# Patient Record
Sex: Female | Born: 1935 | Race: Black or African American | Hispanic: No | Marital: Married | Smoking: Never smoker
Health system: Southern US, Community
[De-identification: ages and names within clinical notes are randomized; demographics above are authoritative.]

## PROBLEM LIST (undated history)

## (undated) DIAGNOSIS — I10 Essential (primary) hypertension: Secondary | ICD-10-CM

## (undated) DIAGNOSIS — H539 Unspecified visual disturbance: Secondary | ICD-10-CM

## (undated) DIAGNOSIS — K449 Diaphragmatic hernia without obstruction or gangrene: Secondary | ICD-10-CM

## (undated) DIAGNOSIS — E079 Disorder of thyroid, unspecified: Secondary | ICD-10-CM

## (undated) DIAGNOSIS — K2100 Gastro-esophageal reflux disease with esophagitis, without bleeding: Secondary | ICD-10-CM

## (undated) DIAGNOSIS — I252 Old myocardial infarction: Secondary | ICD-10-CM

## (undated) DIAGNOSIS — I2510 Atherosclerotic heart disease of native coronary artery without angina pectoris: Secondary | ICD-10-CM

## (undated) HISTORY — PX: REPLACEMENT TOTAL KNEE BILATERAL: SUR1225

## (undated) HISTORY — DX: Unspecified visual disturbance: H53.9

## (undated) HISTORY — PX: JOINT REPLACEMENT: SHX530

## (undated) HISTORY — PX: BIOPSY THYROID: PRO38

## (undated) HISTORY — PX: ABDOMINAL HYSTERECTOMY: SHX81

## (undated) HISTORY — PX: CARDIAC ELECTROPHYSIOLOGY MAPPING AND ABLATION: SHX1292

## (undated) HISTORY — PX: CARDIAC DEFIBRILLATOR PLACEMENT: SHX171

## (undated) HISTORY — DX: Atherosclerotic heart disease of native coronary artery without angina pectoris: I25.10

## (undated) HISTORY — PX: CARDIAC CATHETERIZATION: SHX172

## (undated) HISTORY — DX: Essential (primary) hypertension: I10

---

## 2010-06-27 DIAGNOSIS — I252 Old myocardial infarction: Secondary | ICD-10-CM

## 2010-06-27 HISTORY — DX: Old myocardial infarction: I25.2

## 2014-07-24 ENCOUNTER — Ambulatory Visit (INDEPENDENT_AMBULATORY_CARE_PROVIDER_SITE_OTHER): Payer: Medicare Other | Admitting: Neurology

## 2014-07-24 ENCOUNTER — Encounter: Payer: Self-pay | Admitting: Neurology

## 2014-07-24 DIAGNOSIS — M25512 Pain in left shoulder: Secondary | ICD-10-CM

## 2014-07-24 DIAGNOSIS — M4317 Spondylolisthesis, lumbosacral region: Secondary | ICD-10-CM | POA: Insufficient documentation

## 2014-07-24 DIAGNOSIS — M549 Dorsalgia, unspecified: Secondary | ICD-10-CM | POA: Insufficient documentation

## 2014-07-24 DIAGNOSIS — M25511 Pain in right shoulder: Secondary | ICD-10-CM

## 2014-07-24 DIAGNOSIS — M5441 Lumbago with sciatica, right side: Secondary | ICD-10-CM | POA: Insufficient documentation

## 2014-07-24 DIAGNOSIS — I10 Essential (primary) hypertension: Secondary | ICD-10-CM | POA: Insufficient documentation

## 2014-07-24 DIAGNOSIS — R51 Headache: Secondary | ICD-10-CM

## 2014-07-24 DIAGNOSIS — I519 Heart disease, unspecified: Secondary | ICD-10-CM | POA: Insufficient documentation

## 2014-07-24 DIAGNOSIS — R519 Headache, unspecified: Secondary | ICD-10-CM | POA: Insufficient documentation

## 2014-07-24 DIAGNOSIS — M25519 Pain in unspecified shoulder: Secondary | ICD-10-CM | POA: Insufficient documentation

## 2014-07-24 DIAGNOSIS — M542 Cervicalgia: Secondary | ICD-10-CM

## 2014-07-24 DIAGNOSIS — F411 Generalized anxiety disorder: Secondary | ICD-10-CM | POA: Insufficient documentation

## 2014-07-24 MED ORDER — BACLOFEN 10 MG PO TABS: ORAL_TABLET | ORAL | Status: DC

## 2014-07-24 NOTE — Progress Notes (Signed)
[facility]  PATIENT: Carolyn Patton DOB: 1929-12-11  REFERRING CLINICIAN: Dr. Asher Muir HISTORY FROM: patient REASON FOR VISIT: facial pain and shoulder pain and neck/back pain   HISTORICAL  CHIEF COMPLAINT:  Chief Complaint  Patient presents with  . Facial Pain    Sts. bilat shoulder pain has been some worse--sts. Dr. Asher Muir gave her ?im steroids which helped some.  Heat helps.  Today she has new c/o left sided facial/temporal pain, intermittent, sharp in nature.  Onset one month ago.  Assoc. with h/a.  Some relief with Vistaril 61m tid/fim  . Back Pain  . Neck Pain    HISTORY OF PRESENT ILLNESS:  BAntonela Patton an 81year old woman who I have followed for neck pain, shoulder pain and low back pain with radiculopathy. She was last seen on 05/07/2014 when she had a right piriformis muscle trigger point injection and bilateral C6-C7 paraspinal and trapezius muscle injections.   In the past she has had similar injections as well as subacromial bursa injections, usually with benefit for 3 - 4 months. She has also received some benefit from epidural steroid injections for her back pain.  She began to have some facial pain on the left about 2 months ago. She is getting a spasm of pain that lasts just seconds and is occurring every week or so. Dr. RAsher Muirstarted her on hydroxyzine 25 mg and that has helped a little bit.   The rest of the time, she has a milder low level pain  Her shoulders are painful. The pain is located in the bursa region. Subacromial bursal injections in the past have been of benefit. The last one lasted a few months.  Shoulder pain is worse on the left than the right.   Back pain and hip pain are similar to pain she's had in the past. She would like to have another piriformis injection as these have helped in the past.  Pain radiates down the right leg when more severe   She had a head CT on 12/20/2012 after a minor head injury with a small  concussion. It was normal for her age. ESR has been normal in the past. MRI of the lumbar spine dated 12/02/2009 shows grade 2 anterolisthesis at L5 S1 associated with facet hypertrophy and disc protrusion with strong potential for left greater the right L5 nerve root compression and there could also be impingement of the left S1 nerve root. At L4-L5 there is a combination of degenerative changes causing moderate left foraminal narrowing that could lead to dynamic impingement of the left L4 nerve root.  Nerve conduction EMG study dated 09/01/2010 showed mild left tunnel syndrome. She was stable when compared to a previous one dated December 2007. 9  REVIEW OF SYSTEMS:  Constitutional: No fevers, chills, sweats, or change in appetite Eyes: No visual changes, double vision, eye pain Ear, nose and throat: No hearing loss, ear pain, nasal congestion, sore throat Cardiovascular: No chest pain, palpitations Respiratory:  No shortness of breath at rest or with exertion.   No wheezes GastrointestinaI: No nausea, vomiting, diarrhea, abdominal pain, fecal incontinence Genitourinary:  No dysuria, urinary retention or frequency.  No nocturia. Musculoskeletal:  see above Integumentary: No rash, pruritus, skin lesions Neurological: as above Psychiatric: No depression at this time.  No anxiety Endocrine: No palpitations, diaphoresis, change in appetite, change in weigh or increased thirst Hematologic/Lymphatic:  No anemia, purpura, petechiae. Allergic/Immunologic: No itchy/runny eyes, nasal congestion, recent allergic reactions, rashes  ALLERGIES: Allergies  Allergen Reactions  . Sulfa Antibiotics Itching    HOME MEDICATIONS: No outpatient prescriptions prior to visit.   No facility-administered medications prior to visit.    PAST MEDICAL HISTORY: Past Medical History  Diagnosis Date  . Coronary artery disease   . Hypertension   . Vision abnormalities     PAST SURGICAL HISTORY: Past  Surgical History  Procedure Laterality Date  . Cardiac catheterization      3 stents  . Cardiac electrophysiology mapping and ablation    . Replacement total knee bilateral    . Biopsy thyroid      FAMILY HISTORY: Family History  Problem Relation Age of Onset  . Stroke Mother   . Heart disease Father   . Pneumonia Father     SOCIAL HISTORY:  History   Social History  . Marital Status: Married    Spouse Name: N/A    Number of Children: N/A  . Years of Education: N/A   Occupational History  . Not on file.   Social History Main Topics  . Smoking status: Never Smoker   . Smokeless tobacco: Not on file  . Alcohol Use: No  . Drug Use: No  . Sexual Activity: Not on file   Other Topics Concern  . Not on file   Social History Narrative  . No narrative on file     PHYSICAL EXAM  Filed Vitals:   07/24/14 1047  BP: 166/90  Pulse: 70  Resp: 14  Height: 5' 5" (1.651 m)  Weight: 162 lb 3.2 oz (73.573 kg)    Body mass index is 26.99 kg/(m^2).   General: The patient is well-developed and well-nourished and in no acute distress  Neck: The neck is supple, no carotid bruits are noted.  The neck is mildly tender over lower cervical paraspinal muscles.   Skin: Extremities are without significant edema.  Musculoskeletal:   She is tender over the right >> left piriformis muscle.   Trochanteric bursae are nontender.    She has left > right subacromial bursa tenderness.     Neurologic Exam  Mental status: The patient is alert and oriented x 3 at the time of the examination. The patient has apparent normal recent and remote memory, with an apparently normal attention span and concentration ability.   Speech is normal.  Cranial nerves: Extraocular movements are full.   Facial symmetry is present. There is good facial sensation to soft touch bilaterally.Facial strength is normal.  Trapezius and sternocleidomastoid strength is normal. No dysarthria is noted.   No obvious  hearing deficits are noted.  Motor:  Muscle bulk and tone are normal. Strength is  5 / 5 in all 4 extremities.   Sensory: Sensory testing is intact to pinprick, soft touch, vibration sensation, and position sense on all 4 extremities.  Coordination: Cerebellar testing reveals good finger-nose-finger and heel-to-shin bilaterally.  Gait and station: Station is normal and gait is arthritic. Tandem gait is wide stance but probably normal for age. Romberg is negative.   Reflexes: Deep tendon reflexes are symmetric and normal bilaterally. Plantar responses are normal.    DIAGNOSTIC DATA (LABS, IMAGING, TESTING) - I reviewed patient records, labs, notes, testing and imaging myself where available.    Records, including labs and MRI reports from [facility] were reviewed.     ASSESSMENT AND PLAN  Facial pain  Pain in joint, shoulder region, right  Spondylolisthesis at L5-S1 level  Neck pain  Pain in joint, shoulder region, left  Bilateral   low back pain with right-sided sciatica  Summary, Carolyn Patton is an 81-year-old woman with a new complaint of left-sided facial pain and worsening of her shoulder pain and back pain.  I am not sure what is causing her facial pain. This might be due to trigeminal neuralgia but the events are very rare. At her age, if she has trigeminal neuralgia, this would be more likely due to an abberrent loop of blood vessel.    As the episodes are fairly rare, I will switch her Flexeril to baclofen as baclofen can help trigeminal neuralgia. If this worsens, however, we will need to check an MRI of the brain and I would also consider adding an antiepileptic.  Symptoms are similar to what she has experienced in the past. I would do a bilateral subacromial bursa injection of the shoulders with a total of 40 mg Depo-Medrol in Marcaine using sterile technique.. We will also do trigger point injections into the C6-C7 paraspinal muscles, L5-S1 paraspinal muscles  and the right piriformis muscle with 40 mg Depo Medrol in Marcaine. She tolerated the procedure well.  She will return to see me in 4 months, sooner if she has new or worsening neurologic symptoms.    Richard A. Sater, MD, PhD 07/24/2014, 11:09 AM Certified in Neurology, Clinical Neurophysiology, Sleep Medicine, Pain Medicine and Neuroimaging  [facility] [address], [address] [place], [place] [address] [phone] 

## 2014-07-24 NOTE — Patient Instructions (Addendum)
If pain worsens, call us as we may need to do further tests and change medications

## 2014-09-16 ENCOUNTER — Ambulatory Visit (INDEPENDENT_AMBULATORY_CARE_PROVIDER_SITE_OTHER): Payer: Medicare Other | Admitting: Neurology

## 2014-09-16 ENCOUNTER — Encounter: Payer: Self-pay | Admitting: Neurology

## 2014-09-16 VITALS — BP 148/80 | HR 68 | Resp 14 | Ht 65.0 in | Wt 161.0 lb

## 2014-09-16 DIAGNOSIS — M542 Cervicalgia: Secondary | ICD-10-CM

## 2014-09-16 DIAGNOSIS — M5441 Lumbago with sciatica, right side: Secondary | ICD-10-CM

## 2014-09-16 DIAGNOSIS — M4317 Spondylolisthesis, lumbosacral region: Secondary | ICD-10-CM | POA: Diagnosis not present

## 2014-09-16 DIAGNOSIS — M25519 Pain in unspecified shoulder: Secondary | ICD-10-CM

## 2014-09-16 NOTE — Progress Notes (Signed)
[facility]  PATIENT: Carolyn Patton DOB: 09/25/1929   HISTORICAL  CHIEF COMPLAINT:  Chief Complaint  Patient presents with  . Back Pain    Sts. lower back pain has been worse over the last week.  No new injury.   She saw her pcp and r/o uti.  Sts. shoulder and neck pain are improved since tpi's given at last ov, but still present.  She would like tpi's today if appropriate/fim    HISTORY OF PRESENT ILLNESS:  Carolyn Patton is an 81 year old woman who I have followed for neck pain, shoulder pain and low back pain with radiculopathy. She was last seen on 07/24/14  when she had a right piriformis muscle trigger point injection and bilateral L5, S1 and C6-C7 paraspinal and trapezius muscle injections.   She also had subacromial bursa injections 07/24/14   LBP:   Back pain is similar to pain she's had in the past --- mostly in buttock.  Right worse than leftcurrently. She would like to have another piriformis injection as these have helped in the past.  Pain is not really radiating down the right leg as it has done in past   MRI of the lumbar spine dated 12/02/2009 shows grade 2 anterolisthesis at L5 S1 associated with facet hypertrophy and disc protrusion with strong potential for left greater the right L5 nerve root compression and there could also be impingement of the left S1 nerve root. At L4-L5 there is a combination of degenerative changes causing moderate left foraminal narrowing that could lead to dynamic impingement of the left L4 nerve root.  Shoulder/neck pain:   Her shoulders are better since [facility] joint injection but still having pain, worse with shoulder rotation.   Shoulder pain is worse on the left than the right.   Neck pain is mildly better since TPI in January.  Left side bot bad but right has spasm pain.   Other:    She had a head CT on 12/20/2012 after a minor head injury with a small concussion. It was normal for her age. ESR has been normal in the past.      Nerve conduction EMG study dated 09/01/2010 showed mild left tunnel syndrome. She was stable when compared to a previous one dated December 2007.   REVIEW OF SYSTEMS:  Constitutional: No fevers, chills, sweats, or change in appetite Cardiovascular: No chest pain, palpitations Respiratory:  No shortness of breath at rest or with exertion.   No wheezes Genitourinary:  No dysuria, urinary retention or frequency.  No nocturia. Musculoskeletal:  see above Neurological: as above Psychiatric: No depression at this time.  No anxiety   ALLERGIES: Allergies  Allergen Reactions  . Sulfa Antibiotics Itching    HOME MEDICATIONS: Outpatient Prescriptions Prior to Visit  Medication Sig Dispense Refill  . acetaminophen-codeine (TYLENOL #3) 300-30 MG per tablet Take 1 tablet by mouth every 4 (four) hours as needed. for pain  0  . ALPRAZolam (XANAX) 0.5 MG tablet Take 0.5 mg by mouth 3 (three) times daily as needed for anxiety.    Marland Kitchen amiodarone (PACERONE) 200 MG tablet Take 200 mg by mouth daily.  6  . aspirin 81 MG tablet Take 81 mg by mouth daily.    Marland Kitchen atorvastatin (LIPITOR) 10 MG tablet Take 10 mg by mouth daily.  99  . baclofen (LIORESAL) 10 MG tablet 1/2 to 1 pill po tid 90 each 5  . carvedilol (COREG) 12.5 MG tablet Take 12.5 mg by mouth 2 (  two) times daily.  11  . clopidogrel (PLAVIX) 75 MG tablet Take 75 mg by mouth daily. with food  2  . cyclobenzaprine (FLEXERIL) 5 MG tablet Take 5 mg by mouth 3 (three) times daily as needed for muscle spasms.    Marland Kitchen dicyclomine (BENTYL) 10 MG capsule Take 10 mg by mouth 4 (four) times daily as needed for spasms.    . folic acid (FOLVITE) 1 MG tablet Take 1 mg by mouth daily.    . hydrOXYzine (ATARAX/VISTARIL) 25 MG tablet Take 25 mg by mouth 3 (three) times daily as needed.    Marland Kitchen losartan (COZAAR) 50 MG tablet Take 50 mg by mouth daily.  1  . nitroGLYCERIN (NITROSTAT) 0.4 MG SL tablet Place 0.4 mg under the tongue every 5 (five) minutes as needed for chest  pain.     No facility-administered medications prior to visit.    PAST MEDICAL HISTORY: Past Medical History  Diagnosis Date  . Coronary artery disease   . Hypertension   . Vision abnormalities     PAST SURGICAL HISTORY: Past Surgical History  Procedure Laterality Date  . Cardiac catheterization      3 stents  . Cardiac electrophysiology mapping and ablation    . Replacement total knee bilateral    . Biopsy thyroid      FAMILY HISTORY: Family History  Problem Relation Age of Onset  . Stroke Mother   . Heart disease Father   . Pneumonia Father     SOCIAL HISTORY:  History   Social History  . Marital Status: Married    Spouse Name: N/A  . Number of Children: N/A  . Years of Education: N/A   Occupational History  . Not on file.   Social History Main Topics  . Smoking status: Never Smoker   . Smokeless tobacco: Not on file  . Alcohol Use: No  . Drug Use: No  . Sexual Activity: Not on file   Other Topics Concern  . Not on file   Social History Narrative     PHYSICAL EXAM  Filed Vitals:   09/16/14 1305  BP: 148/80  Pulse: 68  Resp: 14  Height: _0  (1.651 m)  Weight: 161 lb (73.029 kg)    Body mass index is 26.79 kg/(m^2).   General: The patient is well-developed and well-nourished and in no acute distress  Neck: The neck is supple, no carotid bruits are noted.  The right neck is mildly tender over lower cervical paraspinal an trapezius  muscles.   Skin: Extremities are without significant edema.  Musculoskeletal:   She is tender over the right >> left piriformis muscle.   Trochanteric bursae are nontender.    She has only minimal subacromial bursa tenderness.     Neurologic Exam  Mental status: The patient is alert and oriented x 3 at the time of the examination. The patient has apparent normal recent and remote memory, with an apparently normal attention span and concentration ability.   Speech is normal.  Cranial nerves: Extraocular  movements are full.   Facial symmetry is present. There is good facial sensation to soft touch bilaterally.Facial strength is normal.  Trapezius and sternocleidomastoid strength is normal. No dysarthria is noted.   No obvious hearing deficits are noted.  Motor:  Muscle bulk and tone are normal. Strength is  5 / 5 in all 4 extremities.   Sensory: Sensory testing is intact to touch on all 4 extremities.  Gait and station: Station is  normal and gait is arthritic. Tandem gait is wide stance but probably normal for age. Romberg is negative.   Reflexes: Deep tendon reflexes are symmetric and normal bilaterally.    DIAGNOSTIC DATA (LABS, IMAGING, TESTING) - I reviewed patient records, labs, notes, testing and imaging myself where available.    Records, including labs and MRI reports from [facility] were reviewed.     ASSESSMENT AND PLAN  Neck pain  Bilateral low back pain with right-sided sciatica  Spondylolisthesis at L5-S1 level  Pain in joint, shoulder region, unspecified laterality    1.  trigger point injections into the right trapezius and C6-C7 paraspinal muscles and the bilateral piriformis muscle with 80 mg Depo Medrol in Marcaine. She tolerated the procedure well. 2.   contiunue med's 3.    Discussed shoulder exercises.    She will return to see me in 4 months, sooner if she has new or worsening neurologic symptoms.  Lynell Greenhouse A. Felecia Shelling, MD, PhD 7/36/6815, 9:47 PM Certified in Neurology, Clinical Neurophysiology, Sleep Medicine, Pain Medicine and Neuroimaging  [facility] [address] [place], [place] [address] [phone]

## 2014-11-25 ENCOUNTER — Ambulatory Visit: Payer: Medicare Other | Admitting: Neurology

## 2014-12-12 DIAGNOSIS — I471 Supraventricular tachycardia: Secondary | ICD-10-CM | POA: Insufficient documentation

## 2014-12-12 DIAGNOSIS — E785 Hyperlipidemia, unspecified: Secondary | ICD-10-CM | POA: Insufficient documentation

## 2014-12-12 DIAGNOSIS — M25512 Pain in left shoulder: Secondary | ICD-10-CM | POA: Insufficient documentation

## 2014-12-12 DIAGNOSIS — I2510 Atherosclerotic heart disease of native coronary artery without angina pectoris: Secondary | ICD-10-CM | POA: Insufficient documentation

## 2015-01-14 ENCOUNTER — Telehealth: Payer: Self-pay | Admitting: Neurology

## 2015-01-14 ENCOUNTER — Ambulatory Visit (INDEPENDENT_AMBULATORY_CARE_PROVIDER_SITE_OTHER): Payer: Medicare Other | Admitting: Neurology

## 2015-01-14 ENCOUNTER — Encounter: Payer: Self-pay | Admitting: Neurology

## 2015-01-14 VITALS — BP 152/86 | HR 72 | Resp 12 | Ht 65.0 in | Wt 157.0 lb

## 2015-01-14 DIAGNOSIS — M25512 Pain in left shoulder: Secondary | ICD-10-CM

## 2015-01-14 DIAGNOSIS — M25511 Pain in right shoulder: Secondary | ICD-10-CM

## 2015-01-14 DIAGNOSIS — G4700 Insomnia, unspecified: Secondary | ICD-10-CM | POA: Diagnosis not present

## 2015-01-14 DIAGNOSIS — M5441 Lumbago with sciatica, right side: Secondary | ICD-10-CM

## 2015-01-14 DIAGNOSIS — M542 Cervicalgia: Secondary | ICD-10-CM

## 2015-01-14 NOTE — Telephone Encounter (Signed)
I spoke to Mrs Carolyn Patton to check up on her arm numbness and weakness.   She feels the same now as she did around noon today --  We will call again in the morning to check on her.  Effects of marcaine should be worn off after 8 hours

## 2015-01-14 NOTE — Progress Notes (Signed)
[facility]  PATIENT: Carolyn Patton DOB: 12/22/1929   HISTORICAL  CHIEF COMPLAINT:  Chief Complaint  Patient presents with  . Neck Pain    Sts. bilat shoulder pain have been worse, right is worse than left.  Sts. non-radiating lbp has been worse./fim  . Back Pain    HISTORY OF PRESENT ILLNESS:  Carolyn Patton is an 81 year old woman who I have followed for neck pain, shoulder pain and low back pain with radiculopathy. She was last seen in April 2016  when she had a right piriformis muscle trigger point injection and bilateral L5, S1 and C6-C7 paraspinal and trapezius muscle injections.     Shoulder/neck pain:   Shoulder pain is worse again.   In the past, she had benefit from [facility] joint and/or subacromial bursa injections.  This pain feels a little different.   Pain is worse with shoulder elevation and rotation.   Shoulder pain is bilateral    She denies arm weakness.   Neck pain is also worse and is bilateral in the lower neck and trapezius muscles.   It is increased by movements of her neck.     LBP:   Lower back pain is back and is similar to pain she's had in the past.   Pin is always worse on her right.   Pain is radiating to the back of the upper leg on the right but not into the feet.   Piriformis injectionshave helped in the past.     Insomnia:   She has noted some insomnia with both sleep onset and sleep maintenance. She sometimes has trouble getting comfortable.      Data: MRI of the lumbar spine dated 12/02/2009 shows grade 2 anterolisthesis at L5 S1 associated with facet hypertrophy and disc protrusion with strong potential for left greater the right L5 nerve root compression and there could also be impingement of the left S1 nerve root. At L4-L5 there is a combination of degenerative changes causing moderate left foraminal narrowing that could lead to dynamic impingement of the left L4 nerve root.  REVIEW OF SYSTEMS:  Constitutional: No fevers, chills,  sweats, or change in appetite Cardiovascular: No chest pain, palpitations Respiratory:  No shortness of breath at rest or with exertion.   No wheezes Genitourinary:  No dysuria, urinary retention or frequency.  No nocturia. Musculoskeletal:  see above Neurological: as above Psychiatric: No depression at this time.  No anxiety   ALLERGIES: Allergies  Allergen Reactions  . Sulfa Antibiotics Itching    HOME MEDICATIONS: Outpatient Prescriptions Prior to Visit  Medication Sig Dispense Refill  . acetaminophen-codeine (TYLENOL #3) 300-30 MG per tablet Take 1 tablet by mouth every 4 (four) hours as needed. for pain  0  . ALPRAZolam (XANAX) 0.5 MG tablet Take 0.5 mg by mouth 3 (three) times daily as needed for anxiety.    Marland Kitchen amiodarone (PACERONE) 200 MG tablet Take 200 mg by mouth daily.  6  . aspirin 81 MG tablet Take 81 mg by mouth daily.    Marland Kitchen atorvastatin (LIPITOR) 10 MG tablet Take 10 mg by mouth daily.  99  . baclofen (LIORESAL) 10 MG tablet 1/2 to 1 pill po tid 90 each 5  . carvedilol (COREG) 12.5 MG tablet Take 12.5 mg by mouth 2 (two) times daily.  11  . clopidogrel (PLAVIX) 75 MG tablet Take 75 mg by mouth daily. with food  2  . cyclobenzaprine (FLEXERIL) 5 MG tablet Take 5 mg by mouth  3 (three) times daily as needed for muscle spasms.    Marland Kitchen. dicyclomine (BENTYL) 10 MG capsule Take 10 mg by mouth 4 (four) times daily as needed for spasms.    . folic acid (FOLVITE) 1 MG tablet Take 1 mg by mouth daily.    . hydrOXYzine (ATARAX/VISTARIL) 25 MG tablet Take 25 mg by mouth 3 (three) times daily as needed.    Marland Kitchen. losartan (COZAAR) 50 MG tablet Take 50 mg by mouth daily.  1  . nitroGLYCERIN (NITROSTAT) 0.4 MG SL tablet Place 0.4 mg under the tongue every 5 (five) minutes as needed for chest pain.     No facility-administered medications prior to visit.    PAST MEDICAL HISTORY: Past Medical History  Diagnosis Date  . Coronary artery disease   . Hypertension   . Vision abnormalities      PAST SURGICAL HISTORY: Past Surgical History  Procedure Laterality Date  . Cardiac catheterization      3 stents  . Cardiac electrophysiology mapping and ablation    . Replacement total knee bilateral    . Biopsy thyroid      FAMILY HISTORY: Family History  Problem Relation Age of Onset  . Stroke Mother   . Heart disease Father   . Pneumonia Father     SOCIAL HISTORY:  History   Social History  . Marital Status: Married    Spouse Name: N/A  . Number of Children: N/A  . Years of Education: N/A   Occupational History  . Not on file.   Social History Main Topics  . Smoking status: Never Smoker   . Smokeless tobacco: Not on file  . Alcohol Use: No  . Drug Use: No  . Sexual Activity: Not on file   Other Topics Concern  . Not on file   Social History Narrative     PHYSICAL EXAM  Filed Vitals:   01/14/15 1056  BP: 152/86  Pulse: 72  Resp: 12  Height: 5\' 5"  (1.651 m)  Weight: 157 lb (71.215 kg)    Body mass index is 26.13 kg/(m^2).   General: The patient is well-developed and well-nourished and in no acute distress  Neck: The neck is supple.  The right neck is tender over lower cervical paraspinal and trapezius  Muscles on both sides.   Skin: Extremities are without significant edema.  Musculoskeletal:   She is tender over the right > left piriformis muscle.   Trochanteric bursae are nontender.    She has only minimal subacromial bursa tenderness but moderate glenohumeral joint tenderness  Neurologic Exam  Mental status: The patient is alert and oriented x 3 at the time of the examination.Marland Kitchen.   Speech is normal.  Cranial nerves: Extraocular movements are full.   Facial strength is normal.  Trapezius and sternocleidomastoid strength is normal. No dysarthria is noted.   No obvious hearing deficits are noted.  Motor:  Muscle bulk and tone are normal. Strength is  5 / 5 in all 4 extremities.   Sensory: Sensory testing is intact to touch on all 4  extremities.  Gait and station: Station is normal and gait is arthritic. Tandem gait has mildly wide stance but probably normal for age.   Reflexes: Deep tendon reflexes are symmetric and normal bilaterally.        ASSESSMENT AND PLAN  Pain in joint, shoulder region, right  Pain in left shoulder  Neck pain  Bilateral low back pain with right-sided sciatica  Insomnia  1.  trigger point injections into the bilateral trapezius and C6-C7 paraspinal muscles and the bilateral piriformis muscle with 50 mg Depo Medrol in Marcaine. She tolerated the procedure well. 2.   Glenohumeral (shoulder) joint injections bilateral with 30 mg Depo-Medrol in Marcaine using sterile technique. She tolerated the procedure well and shoulder pain was better afterwards. 3.    We discussed shoulder and neck exercises.   4.  If the insomnia worsens, consider trazodone or other medication.  She will return to see me in 3- 4 months, sooner if she has new or worsening neurologic symptoms.  Richard A. Epimenio Foot, MD, PhD 01/14/2015, 11:15 AM Certified in Neurology, Clinical Neurophysiology, Sleep Medicine, Pain Medicine and Neuroimaging  [facility] [address] [place], [place] [address] [phone]  ADDENDUM:  A few minutes after she left the office, she noted weakness and numbness in the right arm. I had her come back and reexamine her. She has numbness and weakness in the right radial nerve distribution. Strength was 5 out of 5 in all other groups. Her shoulder pain was much better and she felt she had better range of motion in the shoulder. She had no tenderness near the site of injection or in the axilla. She had not reported pain during the actual injection earlier. I told her I believe that some of the Marcaine must have been injected outside of the joint, numbing up the right radial nerve. I do not believe that this represents a stroke. I told her if she has significantly more  symptoms that she should go to the emergency room. We will check on her later  RAS

## 2015-01-15 ENCOUNTER — Telehealth: Payer: Self-pay | Admitting: Neurology

## 2015-01-15 NOTE — Telephone Encounter (Signed)
The patient is calling stating numbness and weakness is gone in her arm but she has bad headaches in the back of her head. Please call patient

## 2015-01-15 NOTE — Telephone Encounter (Signed)
I spoke to Carolyn Patton this morning. Her arm started to return back to normal on 8 PM last night. She feels that it is practically back to baseline at this point. She has normal sensation and she can open and close her hand rapidly and extend the wrist. I believe there was some extravasation of the Marcaine/Depo-Medrol mixed numb up the radial nerve.     She does report that she woke up with a bad headache last night. I advised her to take some OTC ibuprofen and give [place] a call if the headache does not resolve.

## 2015-01-16 NOTE — Telephone Encounter (Signed)
I have spoken with Carolyn Patton this morning--she sts. she is back to baseline--feeling well.  I will pass update along to Dr. Lenice Pressman

## 2015-02-11 ENCOUNTER — Ambulatory Visit (INDEPENDENT_AMBULATORY_CARE_PROVIDER_SITE_OTHER): Payer: Medicare Other | Admitting: Neurology

## 2015-02-11 ENCOUNTER — Encounter: Payer: Self-pay | Admitting: Neurology

## 2015-02-11 VITALS — BP 146/82 | HR 72 | Resp 16 | Ht 65.0 in | Wt 153.4 lb

## 2015-02-11 DIAGNOSIS — M25511 Pain in right shoulder: Secondary | ICD-10-CM

## 2015-02-11 DIAGNOSIS — F411 Generalized anxiety disorder: Secondary | ICD-10-CM

## 2015-02-11 DIAGNOSIS — F0781 Postconcussional syndrome: Secondary | ICD-10-CM | POA: Diagnosis not present

## 2015-02-11 DIAGNOSIS — G4700 Insomnia, unspecified: Secondary | ICD-10-CM | POA: Diagnosis not present

## 2015-02-11 DIAGNOSIS — M542 Cervicalgia: Secondary | ICD-10-CM | POA: Diagnosis not present

## 2015-02-11 DIAGNOSIS — M5489 Other dorsalgia: Secondary | ICD-10-CM | POA: Diagnosis not present

## 2015-02-11 MED ORDER — TRAZODONE HCL 50 MG PO TABS: ORAL_TABLET | ORAL | Status: DC

## 2015-02-11 NOTE — Progress Notes (Signed)
[facility]  PATIENT: Carolyn Patton DOB: [birth year]   HISTORICAL  CHIEF COMPLAINT:  Chief Complaint  Patient presents with  . Fatigue    Sts.onset about 3 weeks ago of increased fatigue, intermittent dizziness.  Sts. at the end of July, she was working in her yard, thinks she stayed out in the sun too long.  Afterwards she was more fatigued than usual.  The next morning, she was still fatigued--was walking in her home, fell and hit the back of her head on the sofa.  No loc.  Not sure why she fell--sts. her husband thinks she stumbled.  She then fell again in her kitchen just a few min. later--no injury with this fall.  Sts. she saw her   . Dizziness    pcp, and that ct head and labwork was negative.  She c/o continued generalized fatigue/malaise, intermittent dizziness since then. Sts. dizziness is worse in the mornings, some worse with changes in position.  Sts. some relief of dizziness with Meclizine, but she thinks it causes drowsiness, so she only takes 1/2 of rx'd dose/fim    HISTORY OF PRESENT ILLNESS:  Carolyn Patton is an 81 year old woman who I have followed for neck pain, shoulder pain and low back pain with radiculopathy. She was last seen in July 2016  For shoulder pain.   GH joint injections helped but she did have numbness in right arm x8  hours likely from some marcaine affecting the posterior cord of brachial plexus   --- this was completely resolved the next day and shoulder pain is better.    She fell x 2 recently hitting head the first time.She did not have LOC but had HA but still feels dizzy.   She seems more heat sensitive than in the past.     Husband saw second fall and she tripped/slipped.   She went to Dr. Lawerance Bach office and went to CT.   She was told it was fine.    She is noting that she still has some lightheadedness, worse in the morning.  She also has a headache since she hit the left scalp when she fell.     She feels hot.   Symptoms are  about the same today as last week.    She was prescribed something for dizziness but it makes her dizzy.      Insomnia seems worse.   She sometimes takes alprazolam but it makes her too sleepy the next morning.     She falls asleep ok most night but then wakes up and can't fall back asleep a few hours later.   She notes that she has anxiety when she has trouble falling back asleep.       Mild anxiety many days, as well.   Shoulder/neck pain:   Shoulder pain is better now.   The left shoulder pain resolved.   Right is better.     She denies arm weakness.   Neck pain is milder than last visit.  Marland Kitchen     LBP:   Lower back pain is also better.   Piriformis injectionshave helped in the past.      Data: MRI of the lumbar spine dated 12/02/2009 shows grade 2 anterolisthesis at L5 S1 associated with facet hypertrophy and disc protrusion with strong potential for left greater the right L5 nerve root compression and there could also be impingement of the left S1 nerve root. At L4-L5 there is a combination of degenerative changes causing  moderate left foraminal narrowing that could lead to dynamic impingement of the left L4 nerve root.  REVIEW OF SYSTEMS:  Constitutional: No fevers, chills, sweats, or change in appetite Cardiovascular: No chest pain, palpitations Respiratory:  No shortness of breath at rest or with exertion.   No wheezes Genitourinary:  No dysuria, urinary retention or frequency.  No nocturia. Musculoskeletal:  see above Neurological: as above Psychiatric: No depression at this time.  No anxiety   ALLERGIES: Allergies  Allergen Reactions  . Sulfa Antibiotics Itching    HOME MEDICATIONS: Outpatient Prescriptions Prior to Visit  Medication Sig Dispense Refill  . ALPRAZolam (XANAX) 0.5 MG tablet Take 0.5 mg by mouth 3 (three) times daily as needed for anxiety.    Marland Kitchen amiodarone (PACERONE) 200 MG tablet Take 200 mg by mouth daily.  6  . aspirin 81 MG tablet Take 81 mg by mouth daily.      Marland Kitchen atorvastatin (LIPITOR) 10 MG tablet Take 10 mg by mouth daily.  99  . baclofen (LIORESAL) 10 MG tablet 1/2 to 1 pill po tid 90 each 5  . carvedilol (COREG) 12.5 MG tablet Take 12.5 mg by mouth 2 (two) times daily.  11  . clopidogrel (PLAVIX) 75 MG tablet Take 75 mg by mouth daily. with food  2  . folic acid (FOLVITE) 1 MG tablet Take 1 mg by mouth daily.    Marland Kitchen losartan (COZAAR) 50 MG tablet Take 50 mg by mouth daily.  1  . nitroGLYCERIN (NITROSTAT) 0.4 MG SL tablet Place 0.4 mg under the tongue every 5 (five) minutes as needed for chest pain.    Marland Kitchen acetaminophen-codeine (TYLENOL #3) 300-30 MG per tablet Take 1 tablet by mouth every 4 (four) hours as needed. for pain  0  . dicyclomine (BENTYL) 10 MG capsule Take 10 mg by mouth 4 (four) times daily as needed for spasms.    . hydrOXYzine (ATARAX/VISTARIL) 25 MG tablet Take 25 mg by mouth 3 (three) times daily as needed.    . cyclobenzaprine (FLEXERIL) 5 MG tablet Take 5 mg by mouth 3 (three) times daily as needed for muscle spasms.     No facility-administered medications prior to visit.    PAST MEDICAL HISTORY: Past Medical History  Diagnosis Date  . Coronary artery disease   . Hypertension   . Vision abnormalities     PAST SURGICAL HISTORY: Past Surgical History  Procedure Laterality Date  . Cardiac catheterization      3 stents  . Cardiac electrophysiology mapping and ablation    . Replacement total knee bilateral    . Biopsy thyroid      FAMILY HISTORY: Family History  Problem Relation Age of Onset  . Stroke Mother   . Heart disease Father   . Pneumonia Father     SOCIAL HISTORY:  Social History   Social History  . Marital Status: Married    Spouse Name: N/A  . Number of Children: N/A  . Years of Education: N/A   Occupational History  . Not on file.   Social History Main Topics  . Smoking status: Never Smoker   . Smokeless tobacco: Not on file  . Alcohol Use: No  . Drug Use: No  . Sexual Activity: Not on  file   Other Topics Concern  . Not on file   Social History Narrative     PHYSICAL EXAM  Filed Vitals:   02/11/15 1015  BP: 146/82  Pulse: 72  Resp: 16  Height:  5\' 5"  (1.651 m)  Weight: 153 lb 6.4 oz (69.582 kg)    Body mass index is 25.53 kg/(m^2).   General: The patient is well-developed and well-nourished and in no acute distress  Neck/Head:  The neck is nontender.   Scalp looks ok.     .   Skin: Extremities are without significant edema.  Musculoskeletal:   She has mild  tendernessover the right > left piriformis muscle.   Trochanteric bursae are nontender.    She has only minimal subacromial bursa  and glenohumeral joint tenderness  Neurologic Exam  Mental status: The patient is alert and oriented x 3 at the time of the examination.Marland Kitchen   Speech is normal.  Cranial nerves: Extraocular movements are full.   Facial strength is normal.  Trapezius and sternocleidomastoid strength is normal. No dysarthria is noted.   No obvious hearing deficits are noted.  Motor:  Muscle bulk and tone are normal. Strength is  5 / 5 in all 4 extremities.   Sensory: Sensory testing is intact to touch on all 4 extremities.  Gait and station: Station is normal and gait is arthritic. Tandem gait is mildly wide but probably normal for age.   Reflexes: Deep tendon reflexes are symmetric and normal bilaterally.        ASSESSMENT AND PLAN  Post concussion syndrome  Neck pain  Pain in joint, shoulder region, right  Insomnia  Midline back pain, unspecified location    1.  Add trazodone 25 to 50 mg nightly.   Ok to stop meclizine  2.  Try to do shoulder and neck exercises.   3.   I will see if I can get head CT films on CD  She will return to see me in 3- 4 months, sooner if she has new or worsening neurologic symptoms.  Dniyah Grant A. Epimenio Foot, MD, PhD 02/11/2015, 10:22 AM Certified in Neurology, Clinical Neurophysiology, Sleep Medicine, Pain Medicine and Neuroimaging  [facility] [address] [place], [place] [address] [phone]  ADDENDUM:  A few minutes after she left the office, she noted weakness and numbness in the right arm. I had her come back and reexamine her. She has numbness and weakness in the right radial nerve distribution. Strength was 5 out of 5 in all other groups. Her shoulder pain was much better and she felt she had better range of motion in the shoulder. She had no tenderness near the site of injection or in the axilla. She had not reported pain during the actual injection earlier. I told her I believe that some of the Marcaine must have been injected outside of the joint, numbing up the right radial nerve. I do not believe that this represents a stroke. I told her if she has significantly more symptoms that she should go to the emergency room. We will check on her later  RAS

## 2015-03-03 DIAGNOSIS — R002 Palpitations: Secondary | ICD-10-CM | POA: Insufficient documentation

## 2015-03-03 DIAGNOSIS — R5383 Other fatigue: Secondary | ICD-10-CM | POA: Insufficient documentation

## 2015-03-18 ENCOUNTER — Telehealth: Payer: Self-pay | Admitting: *Deleted

## 2015-03-18 MED ORDER — IMIPRAMINE HCL 25 MG PO TABS: ORAL_TABLET | ORAL | Status: DC

## 2015-03-18 NOTE — Telephone Encounter (Signed)
-----   Message from Asa Lente, MD sent at 03/18/2015  9:07 AM EDT ----- Please let her know I looked at the head CT scan. It is normal for her age.  She reported she was still having headaches.    Let's change the nighttime trazodone to imipramine 25 mg by mouth daily at bedtime #60 one or 2 at bedtime.    Have her stop the trazodone.  Thank you

## 2015-03-18 NOTE — Telephone Encounter (Signed)
I have spoken with Carolyn Patton this morning and per RAS, advised that CT head is normal for age.  I have also advised per RAS, that for h/a's, she may stop Trazodone and start Imipramine , one or two tabs po QHS.  She is agreeable with this plan.  She has been instructed on dosing--she will start with  and try this for several nights.  She may increase to  if she tolerates this well and if  does not sufficiently help h/a's.  She verbalized understanding of same, repeated instructions back to me, including instruction to stop Trazodone.  Imipramine rx. escribed to [facility] in [place] per [name]'s request/fim

## 2015-03-24 DIAGNOSIS — M2140 Flat foot [pes planus] (acquired), unspecified foot: Secondary | ICD-10-CM | POA: Insufficient documentation

## 2015-03-24 DIAGNOSIS — M2030 Hallux varus (acquired), unspecified foot: Secondary | ICD-10-CM | POA: Insufficient documentation

## 2015-04-06 ENCOUNTER — Telehealth: Payer: Self-pay | Admitting: Neurology

## 2015-04-06 ENCOUNTER — Other Ambulatory Visit: Payer: Self-pay | Admitting: Neurology

## 2015-04-06 DIAGNOSIS — F0781 Postconcussional syndrome: Secondary | ICD-10-CM

## 2015-04-06 MED ORDER — HYDROCODONE-ACETAMINOPHEN 5-325 MG PO TABS: 1.0000 | ORAL_TABLET | Freq: Three times a day (TID) | ORAL | Status: DC | PRN

## 2015-04-06 NOTE — Telephone Encounter (Signed)
Patient is calling about her headaches which she states are mainly in her right eye/right side of head. The Rx trazodone 50 mg she stated is too strong for her.  Please call her on her cell [phone]. Thanks!

## 2015-04-07 NOTE — Telephone Encounter (Signed)
I have spoken with Carolyn Patton this morning--he sts. Raysa just left to drive their grandchildren to school and requests I call back in about 20 min./fim

## 2015-04-07 NOTE — Telephone Encounter (Signed)
noted.  Rx. printed on 04-06-15 destroyed/fim

## 2015-04-07 NOTE — Telephone Encounter (Signed)
Pt called back to let Faith know she had enough HYDROcodone-acetaminophen (NORCO/VICODIN) 5-325 MG tablet . She states it is good until September 01, 2015. So she doesn't need RX.

## 2015-04-13 ENCOUNTER — Telehealth: Payer: Self-pay | Admitting: Neurology

## 2015-04-13 ENCOUNTER — Ambulatory Visit
Admission: RE | Admit: 2015-04-13 | Discharge: 2015-04-13 | Disposition: A | Discharge status: Home / Self Care | Payer: Medicare Other | Source: Ambulatory Visit | Attending: Neurology | Admitting: Neurology

## 2015-04-13 DIAGNOSIS — F0781 Postconcussional syndrome: Secondary | ICD-10-CM

## 2015-04-13 NOTE — Telephone Encounter (Signed)
I have spoken with Carolyn Patton this afternoon.  She has Xanax 0.5mg  at home that she takes 1/2 tab prn hs--per RAS, ok to take 0.5mg  30 min prior to MRI (advised must have driver), and take bottle with her so she can repeat once if needed.  She verbalized understanding of same./fim

## 2015-04-13 NOTE — Telephone Encounter (Signed)
Patient is having an MRI today 4:00 today and states she need a medication to relax her.  Please call.

## 2015-04-16 ENCOUNTER — Ambulatory Visit: Payer: Medicare Other | Admitting: Neurology

## 2015-04-17 ENCOUNTER — Telehealth: Payer: Self-pay | Admitting: *Deleted

## 2015-04-17 ENCOUNTER — Encounter: Payer: Self-pay | Admitting: Neurology

## 2015-04-17 ENCOUNTER — Ambulatory Visit (INDEPENDENT_AMBULATORY_CARE_PROVIDER_SITE_OTHER): Payer: Medicare Other | Admitting: Neurology

## 2015-04-17 VITALS — BP 90/58 | HR 64 | Resp 16 | Ht 65.0 in | Wt 150.2 lb

## 2015-04-17 DIAGNOSIS — M5441 Lumbago with sciatica, right side: Secondary | ICD-10-CM | POA: Diagnosis not present

## 2015-04-17 DIAGNOSIS — G4700 Insomnia, unspecified: Secondary | ICD-10-CM | POA: Diagnosis not present

## 2015-04-17 DIAGNOSIS — M25512 Pain in left shoulder: Secondary | ICD-10-CM | POA: Diagnosis not present

## 2015-04-17 DIAGNOSIS — F0781 Postconcussional syndrome: Secondary | ICD-10-CM

## 2015-04-17 DIAGNOSIS — M542 Cervicalgia: Secondary | ICD-10-CM

## 2015-04-17 MED ORDER — CYCLOBENZAPRINE HCL 5 MG PO TABS: 5.0000 mg | ORAL_TABLET | Freq: Every day | ORAL | Status: DC

## 2015-04-17 NOTE — Patient Instructions (Signed)
To help with the headache, neck pain and poor sleep, i want you to do the following:  1.  Take EITHER the lorazepam OR the Bultalb/Acetamin/Caff pill (from Emergency room) but not both.  2.   Take the cyclobenzaprine before bedtime   Use a warm compress on the LEFT arm

## 2015-04-17 NOTE — Telephone Encounter (Signed)
MRI results discussed during ov today/fim

## 2015-04-17 NOTE — Telephone Encounter (Signed)
-----   Message from Asa Lenteichard A Sater, MD sent at 04/13/2015  6:53 PM EDT ----- Please note that the MRI looks normal for age..Marland Kitchen

## 2015-04-17 NOTE — Progress Notes (Signed)
[facility]  PATIENT: Carolyn Patton DOB: [birth year]   HISTORICAL  CHIEF COMPLAINT:  Chief Complaint  Patient presents with  . Postconcussion Syndrome    Sts. h/a's are less frequent and less severe, but it troubles her that they are still present.  Sts. she went to [facility] ED on 10-14 for h/a, sts. was given a rx. for Fioricet, but has not started it.  Sts. pain in both shoulders is worse, left worse than right./fim  . Shoulder Pain    She was not able to tolerate Trazodone for sleep--felt it was too strong for her, even just 1/2 tablet, so she is taking Alprazolam 0.5mg , 1/2 tab at night./fim    HISTORY OF PRESENT ILLNESS:  HA/Head Injury/Insomnia:      Carolyn Patton is an 81 year old woman who hit her head 02/23/15 without LOC and has had severe headache and insomnia since.     She also reports dizziness and lighheadedness.   Headache pain is worse during the night than during the day.   MRI a couple weeks ago was normal for age (personally reviewed).  Headache worsens with talking and exertion.     Imipramine helped the headache some but she felt like something was moving around in her head and she stopped it.   Fioricet was prescribed from ED and only helps a little bit. Lorazepam slightly helps the sleep.    Trazodone did not help much.    Shoulder pain/neck pain::   She reports shoulder pain bilaterally, currently worse on the left (was worse on right).  She also reports left arm pain since blood was drawn last week.       She denies arm weakness.   Neck pain is milder than last visit.  Glenohumeral injections have helped in past.   Muscle relaxants make her sleepy.   .     LBP:   Lower back pain is mild currently, worse on right (but still mild).   Piriformis injections ave helped in the past.   Data: MRI of the lumbar spine dated 12/02/2009 shows grade 2 anterolisthesis at L5 S1 associated with facet hypertrophy and disc protrusion with strong potential for left  greater the right L5 nerve root compression and there could also be impingement of the left S1 nerve root. At L4-L5 there is a combination of degenerative changes causing moderate left foraminal narrowing that could lead to dynamic impingement of the left L4 nerve root.  REVIEW OF SYSTEMS:  Constitutional: No fevers, chills, sweats, or change in appetite Cardiovascular: No chest pain, palpitations Respiratory:  No shortness of breath at rest or with exertion.   No wheezes Genitourinary:  No dysuria, urinary retention or frequency.  No nocturia. Musculoskeletal:  see above Neurological: as above Psychiatric: No depression at this time.  No anxiety   ALLERGIES: Allergies  Allergen Reactions  . Sulfa Antibiotics Itching    HOME MEDICATIONS: Outpatient Prescriptions Prior to Visit  Medication Sig Dispense Refill  . ALPRAZolam (XANAX) 0.5 MG tablet Take 0.5 mg by mouth 3 (three) times daily as needed for anxiety.    Marland Kitchen amiodarone (PACERONE) 200 MG tablet Take 200 mg by mouth daily.  6  . aspirin 81 MG tablet Take 81 mg by mouth daily.    Marland Kitchen atorvastatin (LIPITOR) 10 MG tablet Take 10 mg by mouth daily.  99  . carvedilol (COREG) 12.5 MG tablet Take 12.5 mg by mouth 2 (two) times daily.  11  . clopidogrel (PLAVIX) 75 MG  tablet Take 75 mg by mouth daily. with food  2  . folic acid (FOLVITE) 1 MG tablet Take 1 mg by mouth daily.    Marland Kitchen. HYDROcodone-acetaminophen (NORCO/VICODIN) 5-325 MG tablet Take 1 tablet by mouth every 8 (eight) hours as needed for moderate pain. 90 tablet 0  . losartan (COZAAR) 50 MG tablet Take 50 mg by mouth daily.  1  . nitroGLYCERIN (NITROSTAT) 0.4 MG SL tablet Place 0.4 mg under the tongue every 5 (five) minutes as needed for chest pain.    Marland Kitchen. acetaminophen-codeine (TYLENOL #3) 300-30 MG per tablet Take 1 tablet by mouth every 4 (four) hours as needed. for pain  0  . baclofen (LIORESAL) 10 MG tablet 1/2 to 1 pill po tid (Patient not taking: Reported on 04/17/2015) 90 each 5   . dicyclomine (BENTYL) 10 MG capsule Take 10 mg by mouth 4 (four) times daily as needed for spasms.    . hydrOXYzine (ATARAX/VISTARIL) 25 MG tablet Take 25 mg by mouth 3 (three) times daily as needed.    Marland Kitchen. imipramine (TOFRANIL) 25 MG tablet Take one or two tablets daily at bedtime. (Patient not taking: Reported on 04/17/2015) 60 tablet 3   No facility-administered medications prior to visit.    PAST MEDICAL HISTORY: Past Medical History  Diagnosis Date  . Coronary artery disease   . Hypertension   . Vision abnormalities     PAST SURGICAL HISTORY: Past Surgical History  Procedure Laterality Date  . Cardiac catheterization      3 stents  . Cardiac electrophysiology mapping and ablation    . Replacement total knee bilateral    . Biopsy thyroid      FAMILY HISTORY: Family History  Problem Relation Age of Onset  . Stroke Mother   . Heart disease Father   . Pneumonia Father     SOCIAL HISTORY:  Social History   Social History  . Marital Status: Married    Spouse Name: N/A  . Number of Children: N/A  . Years of Education: N/A   Occupational History  . Not on file.   Social History Main Topics  . Smoking status: Never Smoker   . Smokeless tobacco: Not on file  . Alcohol Use: No  . Drug Use: No  . Sexual Activity: Not on file   Other Topics Concern  . Not on file   Social History Narrative     PHYSICAL EXAM  Filed Vitals:   04/17/15 1226  BP: 90/58  Pulse: 64  Resp: 16  Height: 5\' 5"  (1.651 m)  Weight: 150 lb 3.2 oz (68.13 kg)    Body mass index is 24.99 kg/(m^2).   General: The patient is well-developed and well-nourished and in no acute distress  Neck/Head:  The neck is nontender.   Scalp looks ok.     .   Skin: Extremities are without significant edema.  Musculoskeletal:      She has only moderate glenohumeral joint tenderness on the left.   Mild subacromial bursa tenderness.   She has mild  tendernessover the right > left piriformis muscle.    Trochanteric bursae are nontender.      Neurologic Exam  Mental status: The patient is alert and oriented x 3 at the time of the examination.Marland Kitchen.   Speech is normal.  Cranial nerves: Extraocular movements are full.   Facial strength is normal.  Trapezius and sternocleidomastoid strength is normal. No dysarthria is noted.   No obvious hearing deficits are noted.  Motor:  Muscle bulk and tone are normal. Strength is  5 / 5 in all 4 extremities.   Sensory: Sensory testing is intact to touch on all 4 extremities.  Gait and station: Station is normal and gait is arthritic. Tandem gait is mildly wide but probably normal for age.   Reflexes: Deep tendon reflexes are symmetric and normal bilaterally.         ASSESSMENT AND PLAN  Post concussion syndrome  Insomnia  Pain in left shoulder  Neck pain  Bilateral low back pain with right-sided sciatica   1.   Headaches seem to be due to combination of her postconcussive syndrome and poor sleep. She has difficulty getting comfortable at night and her pain and neck and head worsened. I will have her take either lorazepam or Fioricet (whichever works best for her) and 5 mg cyclobenzaprine at bedtime. Over this combination will help her better. 2.    Try to do shoulder and neck exercises.   3.    She will return to see me in 3- 4 months, sooner if she has new or worsening neurologic symptoms.  Hally Colella A. Epimenio Foot, MD, PhD 04/17/2015, 12:45 PM Certified in Neurology, Clinical Neurophysiology, Sleep Medicine, Pain Medicine and Neuroimaging  [facility] [address], [address] [place], [place] [address] [phone]

## 2015-04-18 ENCOUNTER — Other Ambulatory Visit: Payer: Self-pay

## 2015-06-09 ENCOUNTER — Ambulatory Visit: Payer: Medicare Other | Admitting: Neurology

## 2015-06-15 ENCOUNTER — Ambulatory Visit: Payer: Medicare Other | Admitting: Neurology

## 2015-07-03 DIAGNOSIS — I951 Orthostatic hypotension: Secondary | ICD-10-CM | POA: Insufficient documentation

## 2015-08-25 ENCOUNTER — Telehealth: Payer: Self-pay | Admitting: *Deleted

## 2015-08-25 NOTE — Telephone Encounter (Signed)
I have spoken with Carolyn Patton.  Although h/a's are much improved, she still has sharp pain every once in a while and is curious as to why.  With her hx. of postconcussive syndrome, will go ahead and keep appt. 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

## 2015-08-26 ENCOUNTER — Encounter: Payer: Self-pay | Admitting: Neurology

## 2015-08-26 ENCOUNTER — Ambulatory Visit (INDEPENDENT_AMBULATORY_CARE_PROVIDER_SITE_OTHER): Payer: Medicare Other | Admitting: Neurology

## 2015-08-26 VITALS — BP 110/62 | HR 64 | Resp 14 | Ht 65.0 in | Wt 147.2 lb

## 2015-08-26 DIAGNOSIS — M5432 Sciatica, left side: Secondary | ICD-10-CM

## 2015-08-26 DIAGNOSIS — G4700 Insomnia, unspecified: Secondary | ICD-10-CM | POA: Diagnosis not present

## 2015-08-26 DIAGNOSIS — M542 Cervicalgia: Secondary | ICD-10-CM | POA: Diagnosis not present

## 2015-08-26 DIAGNOSIS — F0781 Postconcussional syndrome: Secondary | ICD-10-CM | POA: Diagnosis not present

## 2015-08-26 DIAGNOSIS — M5431 Sciatica, right side: Secondary | ICD-10-CM | POA: Insufficient documentation

## 2015-08-26 DIAGNOSIS — M25512 Pain in left shoulder: Secondary | ICD-10-CM

## 2015-08-26 DIAGNOSIS — M25511 Pain in right shoulder: Secondary | ICD-10-CM | POA: Diagnosis not present

## 2015-08-26 DIAGNOSIS — M4317 Spondylolisthesis, lumbosacral region: Secondary | ICD-10-CM

## 2015-08-26 MED ORDER — HYDROCODONE-ACETAMINOPHEN 5-325 MG PO TABS: 1.0000 | ORAL_TABLET | Freq: Three times a day (TID) | ORAL | Status: DC | PRN

## 2015-08-26 NOTE — Progress Notes (Signed)
[facility]  PATIENT: Carolyn Patton DOB: 27-Aug-1929   HISTORICAL  CHIEF COMPLAINT:  Chief Complaint  Patient presents with  . Back Pain    Sts. lower back pain has been worse since yesterday--not radiating into legs.  Bilat. shoulder pain is much improved.  She uses a heating pad at night and this helps as well.  Sts. h/a's are also much improved--she now only gets a sharp shooting , brief pain in random spots in head./fim  . Bilat Shoulder Pain  . Post Concussion Syndrome    HISTORY OF PRESENT ILLNESS:  Carolyn Patton is an 82 year old woman with HA, LBP and shoulder pain.  HA/Head Injury/Insomnia:     She hit her head 02/23/15 (without LOC) and has had left sided headache since.    She no longer has lightheadedness/dizziness as she did but still has insomnia.    Headache pain is worse during the night than during the day.   MRI last year after her injury has normal for age (personally reviewed).  Headache worsens with talking and exertion.   Last year, Imipramine helped the headache some but she felt like something was moving around in her head and she stopped it.   Fioricet was prescribed from ED and only helps a little bit.     Trazodone did not help much.     Headache is better with some rest and xanax has helped her insomnia.  Shoulder pain/neck pain::   She reports shoulder pain bilaterally, currently worse on the left (was worse on right).  She also reports left arm pain since blood was drawn last week.       She denies arm weakness.   Neck pain is milder than last visit.  Glenohumeral injections have helped in past.   Muscle relaxants make her sleepy.   .     LBP:   Lower back pain is acting back up again, bilateralyy in the buttock.   Piriformis injections have helped in the past.   Data: MRI of the lumbar spine dated 12/02/2009 shows grade 2 anterolisthesis at L5 S1 associated with facet hypertrophy and disc protrusion with strong potential for left greater  the right L5 nerve root compression and there could also be impingement of the left S1 nerve root. At L4-L5 there is a combination of degenerative changes causing moderate left foraminal narrowing that could lead to dynamic impingement of the left L4 nerve root.  REVIEW OF SYSTEMS:  Constitutional: No fevers, chills, sweats, or change in appetite Cardiovascular: No chest pain, palpitations Respiratory:  No shortness of breath at rest or with exertion.   No wheezes Genitourinary:  No dysuria, urinary retention or frequency.  No nocturia. Musculoskeletal:  see above Neurological: as above Psychiatric: No depression at this time.  No anxiety   ALLERGIES: Allergies  Allergen Reactions  . Sulfa Antibiotics Itching    HOME MEDICATIONS: Outpatient Prescriptions Prior to Visit  Medication Sig Dispense Refill  . ALPRAZolam (XANAX) 0.5 MG tablet Take 0.5 mg by mouth 3 (three) times daily as needed for anxiety.    Marland Kitchen amiodarone (PACERONE) 200 MG tablet Take 200 mg by mouth daily.  6  . aspirin 81 MG tablet Take 81 mg by mouth daily.    Marland Kitchen atorvastatin (LIPITOR) 10 MG tablet Take 10 mg by mouth daily.  99  . baclofen (LIORESAL) 10 MG tablet 1/2 to 1 pill po tid 90 each 5  . carvedilol (COREG) 12.5 MG tablet Take 12.5 mg by  mouth 2 (two) times daily.  11  . clopidogrel (PLAVIX) 75 MG tablet Take 75 mg by mouth daily. with food  2  . folic acid (FOLVITE) 1 MG tablet Take 1 mg by mouth daily.    Marland Kitchen HYDROcodone-acetaminophen (NORCO/VICODIN) 5-325 MG tablet Take 1 tablet by mouth every 8 (eight) hours as needed for moderate pain. 90 tablet 0  . hydrOXYzine (ATARAX/VISTARIL) 25 MG tablet Take 25 mg by mouth 3 (three) times daily as needed.    Marland Kitchen imipramine (TOFRANIL) 25 MG tablet Take one or two tablets daily at bedtime. 60 tablet 3  . losartan (COZAAR) 50 MG tablet Take 50 mg by mouth daily.  1  . nitroGLYCERIN (NITROSTAT) 0.4 MG SL tablet Place 0.4 mg under the tongue every 5 (five) minutes as needed  for chest pain.    Marland Kitchen acetaminophen-codeine (TYLENOL #3) 300-30 MG per tablet Take 1 tablet by mouth every 4 (four) hours as needed. Reported on 08/26/2015  0  . cyclobenzaprine (FLEXERIL) 5 MG tablet Take 1 tablet (5 mg total) by mouth at bedtime. (Patient not taking: Reported on 08/26/2015) 30 tablet 3  . dicyclomine (BENTYL) 10 MG capsule Take 10 mg by mouth 4 (four) times daily as needed for spasms. Reported on 08/26/2015     No facility-administered medications prior to visit.    PAST MEDICAL HISTORY: Past Medical History  Diagnosis Date  . Coronary artery disease   . Hypertension   . Vision abnormalities     PAST SURGICAL HISTORY: Past Surgical History  Procedure Laterality Date  . Cardiac catheterization      3 stents  . Cardiac electrophysiology mapping and ablation    . Replacement total knee bilateral    . Biopsy thyroid      FAMILY HISTORY: Family History  Problem Relation Age of Onset  . Stroke Mother   . Heart disease Father   . Pneumonia Father     SOCIAL HISTORY:  Social History   Social History  . Marital Status: Married    Spouse Name: N/A  . Number of Children: N/A  . Years of Education: N/A   Occupational History  . Not on file.   Social History Main Topics  . Smoking status: Never Smoker   . Smokeless tobacco: Not on file  . Alcohol Use: No  . Drug Use: No  . Sexual Activity: Not on file   Other Topics Concern  . Not on file   Social History Narrative     PHYSICAL EXAM  Filed Vitals:   08/26/15 1119  BP: 110/62  Pulse: 64  Resp: 14  Height:  (1.651 m)  Weight: 147 lb 3.2 oz (66.769 kg)    Body mass index is 24.5 kg/(m^2).   General: The patient is well-developed and well-nourished and in no acute distress  Neck/Head:  The neck is nontender.   Scalp looks ok.     .   Skin: Extremities are without significant edema.  Musculoskeletal:      Bilateral subacromial bursa tenderness.   She has moderate  Tenderness over the  right > left piriformis muscle.   Trochanteric bursae are nontender.       Neurologic Exam  Mental status: The patient is alert and oriented x 3 at the time of the examination.Marland Kitchen   Speech is normal.  Cranial nerves: Extraocular movements are full.   Facial strength is normal.  Trapezius and sternocleidomastoid strength is normal. No dysarthria is noted.   No obvious  hearing deficits are noted.  Motor:  Muscle bulk and tone are normal. Strength is  5 / 5 in all 4 extremities.   Sensory: Sensory testing is intact to touch on all 4 extremities.  Gait and station: Station is normal and gait is arthritic. Tandem gait is wide but probably normal for age.   Reflexes: Deep tendon reflexes are symmetric and normal bilaterally.         ASSESSMENT AND PLAN  Neck pain  Pain of both shoulder joints  Bilateral sciatica  Spondylolisthesis at L5-S1 level  Post concussion syndrome  Insomnia    1.   Headaches seem to be due to combination of her postconcussive syndrome and poor sleep. She has difficulty getting comfortable at night and her pain and neck and head worsened. Baclofen at bedtime.    Ok to continue alprazolam 2.    Inject bilateral piriformis muscles with 40 mg Depo-Medrol in Marcaine using sterile technique.   She tolerated the injection well 3.   Inject bilateral subacromial bursae with 40 mg Depo-Medrol in Marcaine using sterile technique.   She tolerated the injection well 4.    Try to do shoulder and neck exercises.   5     She will return to see me in 3- 4 months, sooner if she has new or worsening neurologic symptoms.  Taden Witter A. Epimenio Foot, MD, PhD 08/26/2015, 11:36 AM Certified in Neurology, Clinical Neurophysiology, Sleep Medicine, Pain Medicine and Neuroimaging  [facility] [address], [address] [place], [place] [address] [phone]

## 2015-09-01 DIAGNOSIS — R079 Chest pain, unspecified: Secondary | ICD-10-CM | POA: Insufficient documentation

## 2015-10-08 DIAGNOSIS — I4891 Unspecified atrial fibrillation: Secondary | ICD-10-CM | POA: Insufficient documentation

## 2015-11-24 DIAGNOSIS — E042 Nontoxic multinodular goiter: Secondary | ICD-10-CM | POA: Insufficient documentation

## 2015-11-24 DIAGNOSIS — E0590 Thyrotoxicosis, unspecified without thyrotoxic crisis or storm: Secondary | ICD-10-CM | POA: Insufficient documentation

## 2015-12-10 ENCOUNTER — Telehealth: Payer: Self-pay | Admitting: Neurology

## 2015-12-10 NOTE — Telephone Encounter (Addendum)
error 

## 2015-12-24 ENCOUNTER — Ambulatory Visit (INDEPENDENT_AMBULATORY_CARE_PROVIDER_SITE_OTHER): Payer: Medicare Other | Admitting: Neurology

## 2015-12-24 ENCOUNTER — Encounter: Payer: Self-pay | Admitting: Neurology

## 2015-12-24 DIAGNOSIS — M4317 Spondylolisthesis, lumbosacral region: Secondary | ICD-10-CM | POA: Diagnosis not present

## 2015-12-24 DIAGNOSIS — M25511 Pain in right shoulder: Secondary | ICD-10-CM

## 2015-12-24 DIAGNOSIS — M5431 Sciatica, right side: Secondary | ICD-10-CM | POA: Diagnosis not present

## 2015-12-24 DIAGNOSIS — M542 Cervicalgia: Secondary | ICD-10-CM | POA: Diagnosis not present

## 2015-12-24 DIAGNOSIS — M5432 Sciatica, left side: Secondary | ICD-10-CM | POA: Diagnosis not present

## 2015-12-24 DIAGNOSIS — M25512 Pain in left shoulder: Secondary | ICD-10-CM | POA: Diagnosis not present

## 2015-12-24 MED ORDER — TRAMADOL HCL 50 MG PO TABS: 50.0000 mg | ORAL_TABLET | Freq: Two times a day (BID) | ORAL | Status: DC | PRN

## 2015-12-24 NOTE — Progress Notes (Signed)
[facility]  PATIENT: Carolyn Patton DOB: 09-07-1929   HISTORICAL  CHIEF COMPLAINT:  Chief Complaint  Patient presents with  . Neck Pain    Sts. neck pain and bilat shoulder pain is some worse. Improved wih heat. Sts. lbp is some worse.  Worse with activity.  She does wear a back brace prn. Sts. h/a's have been some more frequent.  Improved with Advil.  Her blood pressure has been higher--her pcp is adjusting her medications./fim  . Back Pain  . Headache    HISTORY OF PRESENT ILLNESS:  Carolyn Patton is an 82 year old woman with LBP and shoulder pain.  LBP:   Lower back pain spreads across lower back without radiation into legs.  Pain was better after injections in the past.   Pain is acting back up again, bilaterally in the buttock.   Piriformis injections have helped in the past.   Data: MRI of the lumbar spine dated 12/02/2009 shows grade 2 anterolisthesis at L5 S1 associated with facet hypertrophy and disc protrusion with strong potential for left greater the right L5 nerve root compression and there could also be impingement of the left S1 nerve root. At L4-L5 there is a combination of degenerative changes causing moderate left foraminal narrowing that could lead to dynamic impingement of the left L4 nerve root.  Shoulder pain/neck pain::   She reports shoulder pain bilaterally, currently worse on the left (was worse on right).  She also reports left arm pain since blood was drawn last week.       She denies arm weakness.   Neck pain is milder than last visit.  Subac bursa and  Glenohumeral injections have helped in past.   Muscle relaxants make her sleepy.   Marland Kitchen.     HA/Head Injury:  She still has some headache but is better than last visit.      She hit her head 02/23/15 (without LOC) and has had left sided headache since.        MRI last year after her injury has normal for age (personally reviewed).  Headache worsens with talking and exertion.  In 2016, Imipramine  helped the headache some but she felt like something was moving around in her head and she stopped it.   Fioricet and trazodone have not helped much.  Baclofen helped a bit but made her sleepy.    Headache is better with some rest.     REVIEW OF SYSTEMS:  Constitutional: No fevers, chills, sweats, or change in appetite Cardiovascular: No chest pain, palpitations Respiratory:  No shortness of breath at rest or with exertion.   No wheezes Genitourinary:  No dysuria, urinary retention or frequency.  No nocturia. Musculoskeletal:  see above Neurological: as above Psychiatric: No depression at this time.  No anxiety   ALLERGIES: Allergies  Allergen Reactions  . Hydralazine Other (See Comments)    Low blood pressure  . Sulfa Antibiotics Itching    HOME MEDICATIONS: Outpatient Prescriptions Prior to Visit  Medication Sig Dispense Refill  . acetaminophen-codeine (TYLENOL #3) 300-30 MG per tablet Take 1 tablet by mouth every 4 (four) hours as needed. Reported on 08/26/2015  0  . ALPRAZolam (XANAX) 0.5 MG tablet Take 0.5 mg by mouth 3 (three) times daily as needed for anxiety.    Marland Kitchen. amiodarone (PACERONE) 200 MG tablet Take 200 mg by mouth daily.  6  . aspirin 81 MG tablet Take 81 mg by mouth daily.    Marland Kitchen. atorvastatin (LIPITOR) 10  MG tablet Take 10 mg by mouth daily.  99  . baclofen (LIORESAL) 10 MG tablet 1/2 to 1 pill po tid 90 each 5  . carvedilol (COREG) 12.5 MG tablet Take 12.5 mg by mouth 2 (two) times daily.  11  . clopidogrel (PLAVIX) 75 MG tablet Take 75 mg by mouth daily. with food  2  . losartan (COZAAR) 50 MG tablet Take 50 mg by mouth daily.  1  . nitroGLYCERIN (NITROSTAT) 0.4 MG SL tablet Place 0.4 mg under the tongue every 5 (five) minutes as needed for chest pain.    . cyclobenzaprine (FLEXERIL) 5 MG tablet Take 1 tablet (5 mg total) by mouth at bedtime. (Patient not taking: Reported on 08/26/2015) 30 tablet 3  . folic acid (FOLVITE) 1 MG tablet Take 1 mg by mouth daily.    Marland Kitchen.  dicyclomine (BENTYL) 10 MG capsule Take 10 mg by mouth 4 (four) times daily as needed for spasms. Reported on 12/24/2015    . HYDROcodone-acetaminophen (NORCO/VICODIN) 5-325 MG tablet Take 1 tablet by mouth every 8 (eight) hours as needed for moderate pain. 90 tablet 0  . hydrOXYzine (ATARAX/VISTARIL) 25 MG tablet Take 25 mg by mouth 3 (three) times daily as needed.     No facility-administered medications prior to visit.    PAST MEDICAL HISTORY: Past Medical History  Diagnosis Date  . Coronary artery disease   . Hypertension   . Vision abnormalities     PAST SURGICAL HISTORY: Past Surgical History  Procedure Laterality Date  . Cardiac catheterization      3 stents  . Cardiac electrophysiology mapping and ablation    . Replacement total knee bilateral    . Biopsy thyroid      FAMILY HISTORY: Family History  Problem Relation Age of Onset  . Stroke Mother   . Heart disease Father   . Pneumonia Father     SOCIAL HISTORY:  Social History   Social History  . Marital Status: Married    Spouse Name: N/A  . Number of Children: N/A  . Years of Education: N/A   Occupational History  . Not on file.   Social History Main Topics  . Smoking status: Never Smoker   . Smokeless tobacco: Not on file  . Alcohol Use: No  . Drug Use: No  . Sexual Activity: Not on file   Other Topics Concern  . Not on file   Social History Narrative     PHYSICAL EXAM  Filed Vitals:   12/24/15 1045  BP: 136/74  Pulse: 64  Resp: 16  Height: 5\' 5"  (1.651 m)  Weight: 144 lb 8 oz (65.545 kg)    Body mass index is 24.05 kg/(m^2).   General: The patient is well-developed and well-nourished and in no acute distress  Neck/Head:  The neck is nontender.   Scalp looks ok.     .  \ Musculoskeletal:      L >> R subacromial bursa tenderness.   She has moderate  tenderness over the right > left lumbar paraspinal muscles   Trochanteric bursae are nontender.       Neurologic Exam  Mental  status: The patient is alert and oriented x 3 at the time of the examination.Marland Kitchen.   Speech is normal.  Cranial nerves: Extraocular movements are full.   Facial strength is normal.  Trapezius and sternocleidomastoid strength is normal. No dysarthria is noted.   No obvious hearing deficits are noted.  Motor:  Muscle bulk and  tone are normal. Strength is  5 / 5 in all 4 extremities.   Sensory: Sensory testing is intact to touch on all 4 extremities.  Gait and station: Station is normal and gait is arthritic. Tandem gait is wide but probably normal for age.   Reflexes: Deep tendon reflexes are symmetric and normal bilaterally.      ASSESSMENT AND PLAN  Bilateral sciatica  Spondylolisthesis at L5-S1 level  Neck pain  Pain of both shoulder joints   1.    Inject bilateral L3L4L5 paraspinal muscles with 60 mg Depo-Medrol in Marcaine using sterile technique.   She tolerated the injection well 2.   Inject left subacromial bursae with 20 mg Depo-Medrol in Marcaine using sterile technique.   She tolerated the injection well 3.    Try to do shoulder and neck exercises.   4     She will return to see me in 3- 4 months, sooner if she has new or worsening neurologic symptoms.  Rhylen Pulido A. Epimenio Foot, MD, PhD 12/24/2015, 3:18 PM Certified in Neurology, Clinical Neurophysiology, Sleep Medicine, Pain Medicine and Neuroimaging  [facility] [address], [address] [place], [place] [address] [phone]

## 2016-05-03 ENCOUNTER — Encounter: Payer: Self-pay | Admitting: Neurology

## 2016-05-03 ENCOUNTER — Ambulatory Visit (INDEPENDENT_AMBULATORY_CARE_PROVIDER_SITE_OTHER): Payer: Medicare Other | Admitting: Neurology

## 2016-05-03 VITALS — BP 116/72 | HR 68 | Resp 14 | Ht 65.0 in | Wt 142.0 lb

## 2016-05-03 DIAGNOSIS — F0781 Postconcussional syndrome: Secondary | ICD-10-CM | POA: Diagnosis not present

## 2016-05-03 DIAGNOSIS — M5432 Sciatica, left side: Secondary | ICD-10-CM | POA: Diagnosis not present

## 2016-05-03 DIAGNOSIS — M4317 Spondylolisthesis, lumbosacral region: Secondary | ICD-10-CM | POA: Diagnosis not present

## 2016-05-03 DIAGNOSIS — M25511 Pain in right shoulder: Secondary | ICD-10-CM | POA: Diagnosis not present

## 2016-05-03 DIAGNOSIS — M5431 Sciatica, right side: Secondary | ICD-10-CM | POA: Diagnosis not present

## 2016-05-03 DIAGNOSIS — M542 Cervicalgia: Secondary | ICD-10-CM | POA: Diagnosis not present

## 2016-05-03 DIAGNOSIS — M25512 Pain in left shoulder: Secondary | ICD-10-CM

## 2016-05-03 NOTE — Progress Notes (Signed)
[facility]  PATIENT: Carolyn Patton DOB: January 21, 1930   HISTORICAL  CHIEF COMPLAINT:  Chief Complaint  Patient presents with  . Neck Pain    Sts. lbp is some worse today due to yardwork yesterday--she requests tpi today if appropriate. Sts. bilat shoulder pain is some improved--she has seen cardiology due to left shoulder pain and had neg. cardiac w/u.  Today she has new c/o numbness in all toes of both feet, onset 7-8 mos. ago./fim  . Back Pain  . Headache  . Left Shoulder Pain    HISTORY OF PRESENT ILLNESS:  Carolyn NunneryBetty Patton is an 82 year old woman with LBP and shoulder pain.  LBP:   She reports lower back pain across lower back without radiation into legs.  Pain increased with yardwork  And is a little better with a heating pad.  Pain was better after injections in the past.   Pain is acting back up again, bilaterally in the buttock.   Piriformis injections have helped in the past.   Advil helps more than tramadol did.  She occasional take one of her muscle relaxants (baclofen or Flexeril)  Shoulder pain/neck pain::   She reports shoulder pain L > R.   Last year pain was worse on the right.     She denies arm weakness.   Neck pain is mild today.  Subacromial bursa and/or Glenohumeral injections have helped in past, usually > 2 months. Marland Kitchen.     HA/Head Injury:  She still has daily headache since se hit her head 02/23/15 (without LOC). Pain is on the left.      MRI after her injury last year was normal for age (personally reviewed).  Headache worshens with exertion.  In 2016, Imipramine helped the headache some but she felt very strange and stopped.   Fioricet and trazodone have not helped much.  Baclofen helped a bit but made her sleepy.   Lorazepam makes her sleepy.    Headache also improves with rest.   Data: MRI of the lumbar spine dated 12/02/2009 shows grade 2 anterolisthesis at L5 S1 associated with facet hypertrophy and disc protrusion with strong potential for  left greater the right L5 nerve root compression and there could also be impingement of the left S1 nerve root. At L4-L5 there is a combination of degenerative changes causing moderate left foraminal narrowing that could lead to dynamic impingement of the left L4 nerve root.   REVIEW OF SYSTEMS:  Constitutional: No fevers, chills, sweats, or change in appetite Cardiovascular: No chest pain, palpitations Respiratory:  No shortness of breath at rest or with exertion.   No wheezes Genitourinary:  No dysuria, urinary retention or frequency.  No nocturia. Musculoskeletal:  see above Neurological: as above Psychiatric: No depression at this time.  No anxiety   ALLERGIES: Allergies  Allergen Reactions  . Hydralazine Other (See Comments)    Low blood pressure  . Sulfa Antibiotics Itching    HOME MEDICATIONS: Outpatient Medications Prior to Visit  Medication Sig Dispense Refill  . ALPRAZolam (XANAX) 0.5 MG tablet Take 0.5 mg by mouth 3 (three) times daily as needed for anxiety.    Marland Kitchen. amiodarone (PACERONE) 200 MG tablet Take 200 mg by mouth daily.  6  . aspirin 81 MG tablet Take 81 mg by mouth daily.    Marland Kitchen. atorvastatin (LIPITOR) 10 MG tablet Take 10 mg by mouth daily.  99  . baclofen (LIORESAL) 10 MG tablet 1/2 to 1 pill po tid 90 each 5  .  carvedilol (COREG) 12.5 MG tablet Take 12.5 mg by mouth 2 (two) times daily.  11  . clopidogrel (PLAVIX) 75 MG tablet Take 75 mg by mouth daily. with food  2  . folic acid (FOLVITE) 1 MG tablet Take 1 mg by mouth daily.    Marland Kitchen. losartan (COZAAR) 50 MG tablet Take 50 mg by mouth daily.  1  . nitroGLYCERIN (NITROSTAT) 0.4 MG SL tablet Place 0.4 mg under the tongue every 5 (five) minutes as needed for chest pain.    Marland Kitchen. acetaminophen-codeine (TYLENOL #3) 300-30 MG per tablet Take 1 tablet by mouth every 4 (four) hours as needed. Reported on 08/26/2015  0  . cyclobenzaprine (FLEXERIL) 5 MG tablet Take 1 tablet (5 mg total) by mouth at bedtime. (Patient not taking:  Reported on 05/03/2016) 30 tablet 3  . traMADol (ULTRAM) 50 MG tablet Take 1 tablet (50 mg total) by mouth every 12 (twelve) hours as needed. (Patient not taking: Reported on 05/03/2016) 60 tablet 3   No facility-administered medications prior to visit.     PAST MEDICAL HISTORY: Past Medical History:  Diagnosis Date  . Coronary artery disease   . Hypertension   . Vision abnormalities     PAST SURGICAL HISTORY: Past Surgical History:  Procedure Laterality Date  . BIOPSY THYROID    . CARDIAC CATHETERIZATION     3 stents  . CARDIAC ELECTROPHYSIOLOGY MAPPING AND ABLATION    . REPLACEMENT TOTAL KNEE BILATERAL      FAMILY HISTORY: Family History  Problem Relation Age of Onset  . Stroke Mother   . Heart disease Father   . Pneumonia Father     SOCIAL HISTORY:  Social History   Social History  . Marital status: Married    Spouse name: N/A  . Number of children: N/A  . Years of education: N/A   Occupational History  . Not on file.   Social History Main Topics  . Smoking status: Never Smoker  . Smokeless tobacco: Not on file  . Alcohol use No  . Drug use: No  . Sexual activity: Not on file   Other Topics Concern  . Not on file   Social History Narrative  . No narrative on file     PHYSICAL EXAM  Vitals:   05/03/16 1057  BP: 116/72  Pulse: 68  Resp: 14  Weight: 142 lb (64.4 kg)  Height: 5\' 5"  (1.651 m)    Body mass index is 23.63 kg/m.   General: The patient is well-developed and well-nourished and in no acute distress  Neck/Head:  The neck is nontender.     Musculoskeletal:      L > R glenohumeral joint >  subacromial bursa tenderness.   She has moderate  tenderness over the right > left lpiriformis > lumbar paraspinal muscles   Trochanteric bursae are nontender.       Neurologic Exam  Mental status: The patient is alert and oriented x 3 at the time of the examination.Marland Kitchen.   Speech is normal.  Cranial nerves: Extraocular movements are full.    Facial strength is normal.  Trapezius and sternocleidomastoid strength is normal. No dysarthria is noted.   No obvious hearing deficits are noted.  Motor:  Muscle bulk and tone are normal. Strength is  5 / 5 in all 4 extremities.   Sensory: Sensory testing is intact to touch on all 4 extremities.  Gait and station: Station is normal and gait is arthritic. Tandem gait is wide but  probably normal for age.   Reflexes: Deep tendon reflexes are symmetric and normal bilaterally.      ASSESSMENT AND PLAN  Bilateral sciatica  Pain of both shoulder joints  Spondylolisthesis at L5-S1 level  Post concussion syndrome  Neck pain   1.    Inject bilateral piriformis muscles with 40 mg Depo-Medrol in Marcaine using sterile technique.   She tolerated the injections well 2.    Inject bilateral glenohumeral joints with total of 40 mg Depo-Medrol in Marcaine using sterile technique.   She tolerated the injections well 3.    Stay active and exercise as tolerated.   4     She will return to see me in 4 months, sooner if she has new or worsening neurologic symptoms.  Carolyn Patton A. Epimenio Foot, MD, PhD 05/03/2016, 12:18 PM Certified in Neurology, Clinical Neurophysiology, Sleep Medicine, Pain Medicine and Neuroimaging  [facility] [address], [address] [place], [place] [address] [phone] n

## 2016-09-07 ENCOUNTER — Telehealth: Payer: Self-pay | Admitting: Neurology

## 2016-09-07 NOTE — Telephone Encounter (Signed)
I have spoken with Carolyn Patton this morning.  She had to cancel her pending appt. tomorrow but will r/s when she is able/fim

## 2016-09-07 NOTE — Telephone Encounter (Signed)
Pt called said she had gall bladder surgery last Friday and c/a appt for tomorrow. She is wanting to talk with RN, she did not go into detail.

## 2016-09-08 ENCOUNTER — Ambulatory Visit: Payer: Medicare Other | Admitting: Neurology

## 2016-09-20 ENCOUNTER — Ambulatory Visit: Payer: Medicare Other | Admitting: Neurology

## 2016-11-05 ENCOUNTER — Encounter (HOSPITAL_BASED_OUTPATIENT_CLINIC_OR_DEPARTMENT_OTHER): Payer: Self-pay | Admitting: Emergency Medicine

## 2016-11-05 ENCOUNTER — Inpatient Hospital Stay (HOSPITAL_BASED_OUTPATIENT_CLINIC_OR_DEPARTMENT_OTHER)
Admission: EM | Admit: 2016-11-05 | Discharge: 2016-11-08 | DRG: 291 | Disposition: A | Discharge status: Home / Self Care | Payer: Medicare Other | Attending: Family Medicine | Admitting: Family Medicine

## 2016-11-05 ENCOUNTER — Emergency Department (HOSPITAL_BASED_OUTPATIENT_CLINIC_OR_DEPARTMENT_OTHER): Payer: Medicare Other

## 2016-11-05 DIAGNOSIS — K449 Diaphragmatic hernia without obstruction or gangrene: Secondary | ICD-10-CM | POA: Diagnosis present

## 2016-11-05 DIAGNOSIS — E876 Hypokalemia: Secondary | ICD-10-CM | POA: Diagnosis present

## 2016-11-05 DIAGNOSIS — Z7982 Long term (current) use of aspirin: Secondary | ICD-10-CM

## 2016-11-05 DIAGNOSIS — J189 Pneumonia, unspecified organism: Secondary | ICD-10-CM | POA: Diagnosis present

## 2016-11-05 DIAGNOSIS — I252 Old myocardial infarction: Secondary | ICD-10-CM | POA: Diagnosis not present

## 2016-11-05 DIAGNOSIS — E785 Hyperlipidemia, unspecified: Secondary | ICD-10-CM | POA: Diagnosis present

## 2016-11-05 DIAGNOSIS — I509 Heart failure, unspecified: Secondary | ICD-10-CM

## 2016-11-05 DIAGNOSIS — R5383 Other fatigue: Secondary | ICD-10-CM | POA: Diagnosis present

## 2016-11-05 DIAGNOSIS — K219 Gastro-esophageal reflux disease without esophagitis: Secondary | ICD-10-CM

## 2016-11-05 DIAGNOSIS — I10 Essential (primary) hypertension: Secondary | ICD-10-CM | POA: Diagnosis not present

## 2016-11-05 DIAGNOSIS — I4891 Unspecified atrial fibrillation: Secondary | ICD-10-CM | POA: Diagnosis present

## 2016-11-05 DIAGNOSIS — R0602 Shortness of breath: Secondary | ICD-10-CM

## 2016-11-05 DIAGNOSIS — Y95 Nosocomial condition: Secondary | ICD-10-CM | POA: Diagnosis present

## 2016-11-05 DIAGNOSIS — I5031 Acute diastolic (congestive) heart failure: Secondary | ICD-10-CM

## 2016-11-05 DIAGNOSIS — R0600 Dyspnea, unspecified: Secondary | ICD-10-CM

## 2016-11-05 DIAGNOSIS — F419 Anxiety disorder, unspecified: Secondary | ICD-10-CM | POA: Diagnosis present

## 2016-11-05 DIAGNOSIS — I110 Hypertensive heart disease with heart failure: Principal | ICD-10-CM | POA: Diagnosis present

## 2016-11-05 DIAGNOSIS — Z96653 Presence of artificial knee joint, bilateral: Secondary | ICD-10-CM | POA: Diagnosis present

## 2016-11-05 DIAGNOSIS — G4700 Insomnia, unspecified: Secondary | ICD-10-CM | POA: Diagnosis present

## 2016-11-05 DIAGNOSIS — I447 Left bundle-branch block, unspecified: Secondary | ICD-10-CM | POA: Diagnosis present

## 2016-11-05 DIAGNOSIS — I5023 Acute on chronic systolic (congestive) heart failure: Secondary | ICD-10-CM | POA: Diagnosis present

## 2016-11-05 DIAGNOSIS — Z882 Allergy status to sulfonamides status: Secondary | ICD-10-CM | POA: Diagnosis not present

## 2016-11-05 DIAGNOSIS — Z955 Presence of coronary angioplasty implant and graft: Secondary | ICD-10-CM

## 2016-11-05 DIAGNOSIS — E440 Moderate protein-calorie malnutrition: Secondary | ICD-10-CM | POA: Diagnosis present

## 2016-11-05 DIAGNOSIS — Z6822 Body mass index (BMI) 22.0-22.9, adult: Secondary | ICD-10-CM | POA: Diagnosis not present

## 2016-11-05 DIAGNOSIS — Z79899 Other long term (current) drug therapy: Secondary | ICD-10-CM | POA: Diagnosis not present

## 2016-11-05 DIAGNOSIS — R0603 Acute respiratory distress: Secondary | ICD-10-CM

## 2016-11-05 DIAGNOSIS — I2510 Atherosclerotic heart disease of native coronary artery without angina pectoris: Secondary | ICD-10-CM | POA: Diagnosis present

## 2016-11-05 DIAGNOSIS — I360 Nonrheumatic tricuspid (valve) stenosis: Secondary | ICD-10-CM | POA: Diagnosis not present

## 2016-11-05 DIAGNOSIS — R630 Anorexia: Secondary | ICD-10-CM | POA: Diagnosis not present

## 2016-11-05 HISTORY — DX: Disorder of thyroid, unspecified: E07.9

## 2016-11-05 HISTORY — DX: Gastro-esophageal reflux disease with esophagitis: K21.0

## 2016-11-05 HISTORY — DX: Diaphragmatic hernia without obstruction or gangrene: K44.9

## 2016-11-05 HISTORY — DX: Old myocardial infarction: I25.2

## 2016-11-05 HISTORY — DX: Gastro-esophageal reflux disease with esophagitis, without bleeding: K21.00

## 2016-11-05 LAB — CBC WITH DIFFERENTIAL/PLATELET
BASOS ABS: 0 10*3/uL (ref 0.0–0.1)
Basophils Relative: 0 %
Eosinophils Absolute: 0 10*3/uL (ref 0.0–0.7)
Eosinophils Relative: 0 %
HEMATOCRIT: 34.4 % — AB (ref 36.0–46.0)
HEMOGLOBIN: 11.5 g/dL — AB (ref 12.0–15.0)
LYMPHS ABS: 1.7 10*3/uL (ref 0.7–4.0)
LYMPHS PCT: 17 %
MCH: 29.7 pg (ref 26.0–34.0)
MCHC: 33.4 g/dL (ref 30.0–36.0)
MCV: 88.9 fL (ref 78.0–100.0)
Monocytes Absolute: 0.9 10*3/uL (ref 0.1–1.0)
Monocytes Relative: 9 %
NEUTROS ABS: 7.2 10*3/uL (ref 1.7–7.7)
Neutrophils Relative %: 73 %
Platelets: 204 10*3/uL (ref 150–400)
RBC: 3.87 MIL/uL (ref 3.87–5.11)
RDW: 14.2 % (ref 11.5–15.5)
WBC: 9.9 10*3/uL (ref 4.0–10.5)

## 2016-11-05 LAB — COMPREHENSIVE METABOLIC PANEL
ALT: 44 U/L (ref 14–54)
AST: 33 U/L (ref 15–41)
Albumin: 3.3 g/dL — ABNORMAL LOW (ref 3.5–5.0)
Alkaline Phosphatase: 90 U/L (ref 38–126)
Anion gap: 9 (ref 5–15)
BILIRUBIN TOTAL: 0.9 mg/dL (ref 0.3–1.2)
BUN: 8 mg/dL (ref 6–20)
CALCIUM: 8.7 mg/dL — AB (ref 8.9–10.3)
CHLORIDE: 104 mmol/L (ref 101–111)
CO2: 26 mmol/L (ref 22–32)
CREATININE: 0.67 mg/dL (ref 0.44–1.00)
GFR calc Af Amer: >60 mL/min (ref >60)
GFR calc non Af Amer: >60 mL/min (ref >60)
GLUCOSE: 101 mg/dL — AB (ref 65–99)
Potassium: 3 mmol/L — ABNORMAL LOW (ref 3.5–5.1)
Sodium: 139 mmol/L (ref 135–145)
Total Protein: 7.6 g/dL (ref 6.5–8.1)

## 2016-11-05 LAB — D-DIMER, QUANTITATIVE: D-Dimer, Quant: 1.66 ug/mL-FEU — ABNORMAL HIGH (ref 0.00–0.50)

## 2016-11-05 LAB — TROPONIN I: Troponin I: <0.03 ng/mL (ref <0.03)

## 2016-11-05 LAB — BRAIN NATRIURETIC PEPTIDE: B Natriuretic Peptide: 1105.6 pg/mL — ABNORMAL HIGH (ref 0.0–100.0)

## 2016-11-05 LAB — PROTIME-INR
INR: 1.2
Prothrombin Time: 15.2 seconds (ref 11.4–15.2)

## 2016-11-05 LAB — I-STAT CG4 LACTIC ACID, ED: Lactic Acid, Venous: 1.47 mmol/L (ref 0.5–1.9)

## 2016-11-05 LAB — APTT: APTT: 26 s (ref 24–36)

## 2016-11-05 LAB — TSH: TSH: 0.368 u[IU]/mL (ref 0.350–4.500)

## 2016-11-05 MED ORDER — LOSARTAN POTASSIUM 50 MG PO TABS
50.0000 mg | ORAL_TABLET | Freq: Every day | ORAL | Status: DC
  Administered 2016-11-05 – 2016-11-07 (×3): 50 mg via ORAL
  Filled 2016-11-05 (×3): qty 1

## 2016-11-05 MED ORDER — VANCOMYCIN HCL IN DEXTROSE 1-5 GM/200ML-% IV SOLN
1000.0000 mg | Freq: Once | INTRAVENOUS | Status: AC
  Administered 2016-11-05: 1000 mg via INTRAVENOUS
  Filled 2016-11-05: qty 200

## 2016-11-05 MED ORDER — ENOXAPARIN SODIUM 40 MG/0.4ML ~~LOC~~ SOLN
40.0000 mg | SUBCUTANEOUS | Status: DC
  Administered 2016-11-05 – 2016-11-07 (×3): 40 mg via SUBCUTANEOUS
  Filled 2016-11-05 (×3): qty 0.4

## 2016-11-05 MED ORDER — POTASSIUM CHLORIDE CRYS ER 20 MEQ PO TBCR
40.0000 meq | EXTENDED_RELEASE_TABLET | Freq: Once | ORAL | Status: AC
  Administered 2016-11-05: 40 meq via ORAL
  Filled 2016-11-05: qty 2

## 2016-11-05 MED ORDER — DEXTROSE 5 % IV SOLN: 1.0000 g | Freq: Once | INTRAVENOUS | Status: DC

## 2016-11-05 MED ORDER — ATORVASTATIN CALCIUM 10 MG PO TABS
10.0000 mg | ORAL_TABLET | Freq: Every day | ORAL | Status: DC
  Administered 2016-11-05 – 2016-11-08 (×4): 10 mg via ORAL
  Filled 2016-11-05 (×4): qty 1

## 2016-11-05 MED ORDER — CEFEPIME HCL 1 G IJ SOLR
INTRAMUSCULAR | Status: AC
  Administered 2016-11-05: 1000 mg
  Filled 2016-11-05: qty 1

## 2016-11-05 MED ORDER — FUROSEMIDE 10 MG/ML IJ SOLN: 40.0000 mg | Freq: Every day | INTRAMUSCULAR | Status: DC

## 2016-11-05 MED ORDER — ASPIRIN 81 MG PO CHEW
81.0000 mg | CHEWABLE_TABLET | Freq: Every day | ORAL | Status: DC
  Administered 2016-11-06 – 2016-11-08 (×3): 81 mg via ORAL
  Filled 2016-11-05 (×3): qty 1

## 2016-11-05 MED ORDER — CARVEDILOL 12.5 MG PO TABS
12.5000 mg | ORAL_TABLET | Freq: Two times a day (BID) | ORAL | Status: DC
  Administered 2016-11-05 – 2016-11-08 (×6): 12.5 mg via ORAL
  Filled 2016-11-05 (×6): qty 1

## 2016-11-05 MED ORDER — CLOPIDOGREL BISULFATE 75 MG PO TABS
75.0000 mg | ORAL_TABLET | Freq: Every day | ORAL | Status: DC
  Administered 2016-11-05 – 2016-11-08 (×3): 75 mg via ORAL
  Filled 2016-11-05 (×4): qty 1

## 2016-11-05 MED ORDER — HYDROCODONE-HOMATROPINE 5-1.5 MG/5ML PO SYRP
5.0000 mL | ORAL_SOLUTION | Freq: Four times a day (QID) | ORAL | Status: DC | PRN
  Administered 2016-11-06 (×2): 5 mL via ORAL
  Filled 2016-11-05 (×2): qty 5

## 2016-11-05 MED ORDER — POTASSIUM CHLORIDE CRYS ER 20 MEQ PO TBCR
40.0000 meq | EXTENDED_RELEASE_TABLET | Freq: Two times a day (BID) | ORAL | Status: AC
  Administered 2016-11-05 – 2016-11-06 (×3): 40 meq via ORAL
  Filled 2016-11-05 (×3): qty 2

## 2016-11-05 MED ORDER — SODIUM CHLORIDE 0.9 % IV SOLN
500.0000 mg | Freq: Two times a day (BID) | INTRAVENOUS | Status: DC
  Administered 2016-11-06 – 2016-11-07 (×4): 500 mg via INTRAVENOUS
  Filled 2016-11-05 (×5): qty 500

## 2016-11-05 MED ORDER — FUROSEMIDE 10 MG/ML IJ SOLN
40.0000 mg | Freq: Once | INTRAMUSCULAR | Status: AC
  Administered 2016-11-05: 40 mg via INTRAVENOUS
  Filled 2016-11-05: qty 4

## 2016-11-05 MED ORDER — DEXTROSE 5 % IV SOLN
1.0000 g | Freq: Two times a day (BID) | INTRAVENOUS | Status: DC
  Administered 2016-11-06 – 2016-11-07 (×4): 1 g via INTRAVENOUS
  Filled 2016-11-05 (×5): qty 1

## 2016-11-05 MED ORDER — ALPRAZOLAM 0.25 MG PO TABS
0.2500 mg | ORAL_TABLET | Freq: Three times a day (TID) | ORAL | Status: DC | PRN
  Filled 2016-11-05: qty 1

## 2016-11-05 MED ORDER — DEXTROSE 5 % IV SOLN
1.0000 g | Freq: Three times a day (TID) | INTRAVENOUS | Status: DC
  Filled 2016-11-05 (×2): qty 1

## 2016-11-05 MED ORDER — BOOST / RESOURCE BREEZE PO LIQD
1.0000 | Freq: Three times a day (TID) | ORAL | Status: DC
  Administered 2016-11-05 – 2016-11-07 (×7): 1 via ORAL

## 2016-11-05 MED ORDER — AMIODARONE HCL 200 MG PO TABS
200.0000 mg | ORAL_TABLET | Freq: Every day | ORAL | Status: DC
  Administered 2016-11-05 – 2016-11-08 (×4): 200 mg via ORAL
  Filled 2016-11-05 (×4): qty 1

## 2016-11-05 NOTE — H&P (Signed)
History and Physical    Carolyn Patton ZOX:[id number] DOB: [birth year] DOA: 11/05/2016  PCP: Forrest Moron, MD   Patient coming from: [facility]  Chief Complaint: Productive cough, subjective fever, shortness of breath, fatigue  HPI: Carolyn Patton is a 83 y.o. woman with a history of CAD S/P multiple stents, prior MI, HTN, GERD, hiatal hernia, hypothyroidism, and atrial fibrillation (S/P ablation, maintained on amiodarone but no anticoagulation, CHADS-Vasc score 5) who had laparoscopic cholecystectomy in early March at an outside facility.  She required repeat admission for post-operative ileus.  Since then, she has had a dry cough that has become productive to yellow to brown sputum over the past 24 hours.  She reports subjective fevers with chills and sweats.  She has had shortness of breath at rest and dyspnea on exertion.  She reports increased fatigue and weakness.  She has intermittent, fleeting substernal chest pain that she does not relate to cough or exertion.  It is not pleuritic.  She has had palpitations but no syncope.  No weight gain.  She has had intermittent swelling that has been confined to her feet.  She denies orthopnea or PND to me.  Appetite has been poor since surgery.  She actually feels like she is losing weight (clothes are looser).  No nausea, vomiting, or abdominal pain.  Last cath in the [facility] system in March 2017; EF 60%.   ED Course: Chest xray concerning for LUL infiltrate and mild cardiomegaly.  WBC count 9.9.  Lactic acid level 1.47.  Negative troponin.  BNP 1105.  D-Dimer mildly elevated at 1.66 (attributed to infection).  Potassium 3.  Blood cultures have been drawn.  She has received vancomycin and cefepime.  She received lasix 40mg  IV one time.  Hospitalist asked to admit.  Review of Systems: As per HPI otherwise 10 systems reviewed and negative.   Past Medical History:  Diagnosis Date  . Coronary artery disease   . Hiatal hernia   . Hypertension   . MI,  old 2012  . Reflux esophagitis   . Thyroid disease   . Vision abnormalities     Past Surgical History:  Procedure Laterality Date  . ABDOMINAL HYSTERECTOMY    . BIOPSY THYROID    . CARDIAC CATHETERIZATION     3 stents  . CARDIAC ELECTROPHYSIOLOGY MAPPING AND ABLATION    . JOINT REPLACEMENT    . REPLACEMENT TOTAL KNEE BILATERAL       reports that she has never smoked. She has never used smokeless tobacco. She reports that she does not drink alcohol or use drugs.  She is married.  She has two sons.  Allergies  Allergen Reactions  . Hydralazine Other (See Comments)    Low blood pressure  . Sulfa Antibiotics Itching    Family History  Problem Relation Age of Onset  . Stroke Mother   . Heart disease Father   . Pneumonia Father      Prior to Admission medications   Medication Sig Start Date End Date Taking? Authorizing Provider  ALPRAZolam (NIRAVAM) 0.25 MG dissolvable tablet Take 0.25 mg by mouth 3 (three) times daily as needed for anxiety or sleep.    Yes [provider]  amiodarone (PACERONE) 200 MG tablet Take 200 mg by mouth daily. 06/29/14  Yes [provider]  aspirin 81 MG tablet Take 81 mg by mouth daily.   Yes [provider]  atorvastatin (LIPITOR) 10 MG tablet Take 10 mg by mouth daily. 06/20/14  Yes  [provider]  B Complex Vitamins (VITAMIN-B COMPLEX) TABS Take 1 tablet by mouth daily.   Yes [provider]  carvedilol (COREG) 12.5 MG tablet Take 12.5 mg by mouth 2 (two) times daily. 07/05/14  Yes [provider]  clopidogrel (PLAVIX) 75 MG tablet Take 75 mg by mouth daily. with food 04/19/14  Yes [provider]  folic acid (FOLVITE) 1 MG tablet Take 1 mg by mouth daily.   Yes [provider]  HYDROcodone-homatropine (HYCODAN) 5-1.5 MG/5ML syrup Take 5 mLs by mouth every 6 (six) hours as needed for cough.   Yes [provider]  losartan (COZAAR) 50 MG tablet Take 50 mg by mouth daily.  07/13/14  Yes [provider]  nitroGLYCERIN (NITROSTAT) 0.4 MG SL tablet Place 0.4 mg under the tongue every 5 (five) minutes as needed for chest pain.   Yes [provider]  spironolactone (ALDACTONE) 25 MG tablet Take 25 mg by mouth daily.   Yes [provider]  acetaminophen-codeine (TYLENOL #3) 300-30 MG per tablet Take 1 tablet by mouth every 4 (four) hours as needed. Reported on 08/26/2015 06/26/14   [provider]  baclofen (LIORESAL) 10 MG tablet 1/2 to 1 pill po tid 07/24/14   Sater, Pearletha Furlichard A, MD  buPROPion (WELLBUTRIN XL) 150 MG 24 hr tablet Take 150 mg by mouth daily. 10/06/16   [provider]  cefdinir (OMNICEF) 300 MG capsule Take 300 mg by mouth 2 (two) times daily. Started 5/10 x 10 days 11/03/16   [provider]  cyanocobalamin 500 MCG tablet Take 500 mcg by mouth daily.    [provider]  cyclobenzaprine (FLEXERIL) 5 MG tablet Take 1 tablet (5 mg total) by mouth at bedtime. Patient not taking: Reported on 05/03/2016 04/17/15   Sater, Pearletha Furlichard A, MD  predniSONE (DELTASONE) 5 MG tablet Take 5 mg by mouth 2 (two) times daily. Started 5/7 x 9 days 10/31/16   [provider]  traMADol (ULTRAM) 50 MG tablet Take 1 tablet (50 mg total) by mouth every 12 (twelve) hours as needed. Patient not taking: Reported on 05/03/2016 12/24/15   Sater, Pearletha Furlichard A, MD  zolpidem (AMBIEN) 5 MG tablet Take 5 mg by mouth daily. 10/27/16   [provider]    Physical Exam: Vitals:   11/05/16 1530 11/05/16 1600 11/05/16 1630 11/05/16 1824  BP: (!) 165/88 (!) 163/108 (!) 166/110 (!) 149/83  Pulse: 77 77 87 79  Resp: (!) 24 16 (!) 22 18  Temp:    98.4 F (36.9 C)  TempSrc:    Oral  SpO2: 93% 100% 99% 94%  Weight:    58.3 kg (128 lb 8.5 oz)  Height:    5\' 4"  (1.626 m)      Constitutional: NAD, calm, comfortable, NONtoxic appearing Vitals:   11/05/16 1530 11/05/16 1600 11/05/16 1630 11/05/16 1824  BP: (!) 165/88 (!) 163/108  (!) 166/110 (!) 149/83  Pulse: 77 77 87 79  Resp: (!) 24 16 (!) 22 18  Temp:    98.4 F (36.9 C)  TempSrc:    Oral  SpO2: 93% 100% 99% 94%  Weight:    58.3 kg (128 lb 8.5 oz)  Height:    5\' 4"  (1.626 m)   Eyes: PERRL, lids and conjunctivae normal ENMT: Mucous membranes are moist. Posterior pharynx clear of any exudate or lesions. Normal dentition.  Neck: normal appearance, supple, no masses Respiratory: Increased crackles on the left compared to the right.  No  wheezing.  Normal respiratory effort. No accessory muscle use.  Cardiovascular: Normal rate, regular rhythm.  Split S2 (vs S3?).  No murmurs / rubs.   No extremity edema. 2+ pedal pulses. No carotid bruits.  GI: abdomen is soft and compressible.  No distention.  No tenderness.  Abdominal scars are well healed.  Bowel sounds are present. Musculoskeletal:  No joint deformity in upper and lower extremities. Good ROM, no contractures. Normal muscle tone.  Skin: no rashes, warm and dry Neurologic: CN 2-12 grossly intact. Sensation intact, Strength symmetric bilaterally. Psychiatric: Normal judgment and insight. Alert and oriented x 3. Normal mood.     Labs on Admission: I have personally reviewed following labs and imaging studies  CBC:  Recent Labs Lab 11/05/16 1350  WBC 9.9  NEUTROABS 7.2  HGB 11.5*  HCT 34.4*  MCV 88.9  PLT 204   Basic Metabolic Panel:  Recent Labs Lab 11/05/16 1201  NA 139  K 3.0*  CL 104  CO2 26  GLUCOSE 101*  BUN 8  CREATININE 0.67  CALCIUM 8.7*   GFR: Estimated Creatinine Clearance: 45.2 mL/min (by C-G formula based on SCr of 0.67 mg/dL). Liver Function Tests:  Recent Labs Lab 11/05/16 1201  AST 33  ALT 44  ALKPHOS 90  BILITOT 0.9  PROT 7.6  ALBUMIN 3.3*   Cardiac Enzymes:  Recent Labs Lab 11/05/16 1122  TROPONINI <0.03   BNP (last 3 results) 1105  Sepsis Labs:  Lactic acid level 1.47  Radiological Exams on Admission: Dg Chest 2 View  Result Date:  11/05/2016 CLINICAL DATA:  83 year old female with productive cough following endoscopy and swallowing test EXAM: CHEST  2 VIEW COMPARISON:  Prior chest x-ray 11/03/2016 FINDINGS: Stable cardiac and mediastinal contours with borderline cardiomegaly. There is a background of diffuse chronic bronchitic change and mild interstitial prominence. Superimposed on this there is patchy airspace opacity in the left upper lobe which is new. No pneumothorax or pleural effusion. No acute osseous abnormality. Degenerative osteoarthritis at the left glenohumeral joint. IMPRESSION: 1. Patchy left upper lobe airspace opacity concerning for bronchopneumonia, or less likely aspiration. 2. Stable background chronic bronchitic changes and interstitial prominence. 3. Borderline cardiomegaly. Electronically Signed   By: Malachy Moan M.D.   On: 11/05/2016 12:08    EKG: Independently reviewed. NSR.  LBBB (not new based on documentation in Care Everywhere)  Assessment/Plan Principal Problem:   HCAP (healthcare-associated pneumonia) Active Problems:   Essential hypertension   CAD in native artery   HLD (hyperlipidemia)   Fatigue   Dyspnea   Acute on chronic congestive heart failure (HCC)   Decreased appetite   GERD (gastroesophageal reflux disease)   Hiatal hernia      HCAP --Continue vanc and cefepime for now --Blood cultures pending --Sputum culture if the patient can produce a sample --Urine legionella and streptococcal antigens --Incentive spirometer q1h while awake.  Turn q2h. --Hycodan cough syrup as needed --Wean supplemental oxygen as tolerated (the patient is not on home O2)  Signs and symptoms concerning for new CHF vs ACS (dyspnea, fatigue, intermittent swelling, elevated BNP) --IV lasix given one time in the ED.  HOLD on additional doses for now; the patient does not appear grossly volume overloaded to me. --Serial troponin --Echo in the AM --Telemetry monitoring --Check TFTs  Decreased  appetite with moderate protein calorie malnutrition --Dietary consult --Consider trial of megace  Hypokalemia --Oral replacement ordered  History of atrial fibrillation --Amiodarone  CAD --Baby aspirin, plavix  HLD --Statin  HTN --  Coreg, lorsartan  Anxiety --Xanax prn  DVT prophylaxis: Lovenox Code Status: FULL Family Communication: Husband, two sons, DIL present at bedside at time of admission. Disposition Plan: To be determined. Consults called: NONE Admission status: Inpatient, telemetry.  I expect this patient will need inpatient services for greater than two midnights.   TIME SPENT: 70 minutes   Jerene Bears MD [facility] Pager [phone]  If 7PM-7AM, please contact night-coverage [email or web address] Password [facility]  11/05/2016, 7:14 PM

## 2016-11-05 NOTE — ED Triage Notes (Signed)
Patient states she has a two month history of non productive cough since having Gallbladder surgery on March 9,2018.  States this past week she was seen by her PCP [place] Lev[place] [facility], and had lab work and chest xray for the same symptoms as today.  Was told that her Potassium was low and she had fluid in her lungs.  Was treated with Hycodan cough syrup, spironolactone, and Cefdinir.  States this morning she feels worse, is having swelling in her lower extremities and her cough is productive and light brown, c/o nausea and generalized malaise.

## 2016-11-05 NOTE — ED Notes (Signed)
Family at bedside. 

## 2016-11-05 NOTE — ED Notes (Signed)
Pt on cardiac monitor and auto VS. Pt able to stand and pivot to lowered bed from wheelchair, but appears to be weak and slow moving. Husband states she is a lot weaker than her normal.

## 2016-11-05 NOTE — ED Provider Notes (Signed)
[facility] Provider Note   CSN: [id number] Arrival date & time: 11/05/16  1015     History   Chief Complaint Chief Complaint  Patient presents with  . Cough    HPI Carolyn Patton is a 83 y.o. female.  HPI  2 months of cough off and on, dry cough, worse over the last week or so. Not getting anything up until started hycocet from Dr this week.  Now coughing up yellow-green-brown, small amount.  Mild shortness of breath.  Severe fatigue.  Shortness of breath with minimal exertion.  Sleeping with many pillows, was before because of hiatal hernia, now reports dyspnea while laying down. Swelling of bilateral feet noticed it yesterday, shoes tighter this week.  Has hx of MI, no known prior hx of CHF, but with XR this week was told tehre was some fluid. Went to Dr. And had XR and rx was called in on Thursday. Temperature 99, chills yesterday.  Slight chest pain every now and then, tightness and short of breath, this AM felt very fatigue and mild tightness, heart palpitations.     Past Medical History:  Diagnosis Date  . Coronary artery disease   . Hiatal hernia   . Hypertension   . MI, old 2012  . Reflux esophagitis   . Thyroid disease   . Vision abnormalities     Patient Active Problem List   Diagnosis Date Noted  . Dyspnea 11/05/2016  . Hyperthyroidism 11/24/2015  . Multinodular goiter 11/24/2015  . Atrial fibrillation (HCC) 10/08/2015  . Chest pain 09/01/2015  . Bilateral sciatica 08/26/2015  . Hypotension, postural 07/03/2015  . Acquired hallux varus 03/24/2015  . Acquired flat foot 03/24/2015  . Fatigue 03/03/2015  . Awareness of heartbeats 03/03/2015  . Post concussion syndrome 02/11/2015  . Insomnia 01/14/2015  . CAD in native artery 12/12/2014  . HLD (hyperlipidemia) 12/12/2014  . Pain in left shoulder 12/12/2014  . Supraventricular tachycardia (HCC) 12/12/2014  . Essential hypertension 07/24/2014  . Heart disease 07/24/2014  . Generalized anxiety  disorder 07/24/2014  . Facial pain 07/24/2014  . Pain in joint, shoulder region 07/24/2014  . Back pain 07/24/2014  . Spondylolisthesis at L5-S1 level 07/24/2014  . Neck pain 07/24/2014  . Bilateral low back pain with right-sided sciatica 07/24/2014    Past Surgical History:  Procedure Laterality Date  . ABDOMINAL HYSTERECTOMY    . BIOPSY THYROID    . CARDIAC CATHETERIZATION     3 stents  . CARDIAC ELECTROPHYSIOLOGY MAPPING AND ABLATION    . JOINT REPLACEMENT    . REPLACEMENT TOTAL KNEE BILATERAL      OB History    No data available       Home Medications    Prior to Admission medications   Medication Sig Start Date End Date Taking? Authorizing Provider  acetaminophen-codeine (TYLENOL #3) 300-30 MG per tablet Take 1 tablet by mouth every 4 (four) hours as needed. Reported on 08/26/2015 06/26/14  Yes [provider]  ALPRAZolam Prudy Feeler) 0.5 MG tablet Take 0.5 mg by mouth 3 (three) times daily as needed for anxiety.   Yes [provider]  amiodarone (PACERONE) 200 MG tablet Take 200 mg by mouth daily. 06/29/14  Yes [provider]  aspirin 81 MG tablet Take 81 mg by mouth daily.   Yes [provider]  atorvastatin (LIPITOR) 10 MG tablet Take 10 mg by mouth daily. 06/20/14  Yes [provider]  carvedilol (COREG) 12.5 MG tablet Take 12.5 mg  by mouth 2 (two) times daily. 07/05/14  Yes [provider]  cefdinir (OMNICEF) 300 MG capsule Take 300 mg by mouth 2 (two) times daily.   Yes [provider]  clopidogrel (PLAVIX) 75 MG tablet Take 75 mg by mouth daily. with food 04/19/14  Yes [provider]  folic acid (FOLVITE) 1 MG tablet Take 1 mg by mouth daily.   Yes [provider]  HYDROcodone-homatropine (HYCODAN) 5-1.5 MG/5ML syrup Take 5 mLs by mouth every 6 (six) hours as needed for cough.   Yes [provider]  losartan (COZAAR) 50 MG tablet Take 50 mg by mouth daily. 07/13/14  Yes [provider]  nitroGLYCERIN (NITROSTAT) 0.4 MG SL tablet Place 0.4 mg under the tongue every 5 (five) minutes as needed for chest pain.   Yes [provider]  spironolactone (ALDACTONE) 25 MG tablet Take 25 mg by mouth daily.   Yes [provider]  baclofen (LIORESAL) 10 MG tablet 1/2 to 1 pill po tid 07/24/14   Sater, Pearletha Furl, MD  cyclobenzaprine (FLEXERIL) 5 MG tablet Take 1 tablet (5 mg total) by mouth at bedtime. Patient not taking: Reported on 05/03/2016 04/17/15   Sater, Pearletha Furl, MD  traMADol (ULTRAM) 50 MG tablet Take 1 tablet (50 mg total) by mouth every 12 (twelve) hours as needed. Patient not taking: Reported on 05/03/2016 12/24/15   Asa Lente, MD    Family History Family History  Problem Relation Age of Onset  . Stroke Mother   . Heart disease Father   . Pneumonia Father     Social History Social History  Substance Use Topics  . Smoking status: Never Smoker  . Smokeless tobacco: Never Used  . Alcohol use No     Allergies   Hydralazine and Sulfa antibiotics   Review of Systems Review of Systems  Constitutional: Positive for fatigue. Negative for fever.  HENT: Negative for sore throat.   Eyes: Negative for visual disturbance.  Respiratory: Positive for cough and shortness of breath.   Cardiovascular: Negative for chest pain.  Gastrointestinal: Negative for abdominal pain, nausea and vomiting.  Genitourinary: Negative for difficulty urinating.  Musculoskeletal: Negative for back pain and neck pain.  Skin: Negative for rash.  Neurological: Negative for syncope and headaches.     Physical Exam Updated Vital Signs BP (!) 166/110   Pulse 87   Temp 99.4 F (37.4 C) (Oral)   Resp (!) 22   Ht 5\' 4"  (1.626 m)   Wt 134 lb (60.8 kg)   SpO2 99%   BMI 23.00 kg/m   Physical Exam  Constitutional: She is oriented to person, place, and time. She appears well-developed and well-nourished. She appears ill (appears fatigued). No distress.    HENT:  Head: Normocephalic and atraumatic.  Eyes: Conjunctivae and EOM are normal.  Neck: Normal range of motion. JVD present.  Cardiovascular: Normal rate, regular rhythm, normal heart sounds and intact distal pulses.  Exam reveals no gallop and no friction rub.   No murmur heard. Pulmonary/Chest: Effort normal. No respiratory distress. She has no wheezes. She has rales (bibasilar).  Abdominal: Soft. She exhibits no distension. There is no tenderness. There is no guarding.  Musculoskeletal: She exhibits no edema or tenderness.       Right ankle: She exhibits swelling.       Left ankle: She exhibits swelling.  Neurological: She is alert and oriented to person, place, and time.  Skin: Skin is warm and dry. No  rash noted. She is not diaphoretic. No erythema.  Nursing note and vitals reviewed.    ED Treatments / Results  Labs (all labs ordered are listed, but only abnormal results are displayed) Labs Reviewed  BRAIN NATRIURETIC PEPTIDE - Abnormal; Notable for the following:       Result Value   B Natriuretic Peptide 1,105.6 (*)    All other components within normal limits  COMPREHENSIVE METABOLIC PANEL - Abnormal; Notable for the following:    Potassium 3.0 (*)    Glucose, Bld 101 (*)    Calcium 8.7 (*)    Albumin 3.3 (*)    All other components within normal limits  D-DIMER, QUANTITATIVE (NOT AT [facility]) - Abnormal; Notable for the following:    D-Dimer, Quant 1.66 (*)    All other components within normal limits  CBC WITH DIFFERENTIAL/PLATELET - Abnormal; Notable for the following:    Hemoglobin 11.5 (*)    HCT 34.4 (*)    All other components within normal limits  CULTURE, BLOOD (ROUTINE X 2)  CULTURE, BLOOD (ROUTINE X 2)  TROPONIN I  CBC WITH DIFFERENTIAL/PLATELET  I-STAT CG4 LACTIC ACID, ED  I-STAT CG4 LACTIC ACID, ED    EKG  EKG Interpretation  Date/Time:  Saturday Nov 05 2016 11:00:32 EDT Ventricular Rate:  71 PR Interval:    QRS Duration: 154 QT  Interval:  455 QTC Calculation: 495 R Axis:   14 Text Interpretation:  Sinus rhythm Prolonged PR interval Probable left atrial enlargement Left bundle branch block No previous ECGs available Confirmed by [facility] MD, Ramzi Brathwaite ([id number]) on 11/05/2016 3:26:59 PM       Radiology Dg Chest 2 View  Result Date: 11/05/2016 CLINICAL DATA:  83 year old female with productive cough following endoscopy and swallowing test EXAM: CHEST  2 VIEW COMPARISON:  Prior chest x-ray 11/03/2016 FINDINGS: Stable cardiac and mediastinal contours with borderline cardiomegaly. There is a background of diffuse chronic bronchitic change and mild interstitial prominence. Superimposed on this there is patchy airspace opacity in the left upper lobe which is new. No pneumothorax or pleural effusion. No acute osseous abnormality. Degenerative osteoarthritis at the left glenohumeral joint. IMPRESSION: 1. Patchy left upper lobe airspace opacity concerning for bronchopneumonia, or less likely aspiration. 2. Stable background chronic bronchitic changes and interstitial prominence. 3. Borderline cardiomegaly. Electronically Signed   By: Malachy MoanHeath  McCullough M.D.   On: 11/05/2016 12:08    Procedures Procedures (including critical care time)  Medications Ordered in ED Medications  ceFEPIme (MAXIPIME) 1 g in dextrose 5 % 50 mL IVPB (1 g Intravenous Not Given 11/05/16 1350)  vancomycin (VANCOCIN) IVPB 1000 mg/200 mL premix (0 mg Intravenous Stopped 11/05/16 1618)    Followed by  vancomycin (VANCOCIN) 500 mg in sodium chloride 0.9 % 100 mL IVPB (not administered)  ceFEPIme (MAXIPIME) 1 g injection (1,000 mg  Given 11/05/16 1350)  potassium chloride SA (K-DUR,KLOR-CON) CR tablet 40 mEq (40 mEq Oral Given 11/05/16 1602)  furosemide (LASIX) injection 40 mg (40 mg Intravenous Given 11/05/16 1602)     Initial Impression / Assessment and Plan / ED Course  I have reviewed the triage vital signs and the nursing notes.  Pertinent labs & imaging  results that were available during my care of the patient were reviewed by me and considered in my medical decision making (see chart for details).     83 year old female with a history of coronary artery disease, it atrial fibrillation distantly with SVT subsequently, on amiodarone, cholecystectomy 09/02/2016, hypertension, hyperlipidemia, presents with  concern for 2 months of cough and dyspnea, with significant worsening over the last week, and diagnosis by her PCP of pneumonia days ago.  EKG without significant abnormalities, no previous to compare.    Chest x-ray shows LUL pneumonia, interstitial prominence. BNP is elevated to greater than 1000.  Initially ordered ddimer given recent surgery, however after further history, examination, chest x-ray and BNP findings, suspect her symptoms are more likely secondary to pneumonia and congestive heart failure, and unlikely represent pulmonary embolus. Feel the d-dimer elevation is likely inflammatory in the setting of pneumonia.  Patient with normal vital signs, normal lactic acid, however reports dyspnea at rest and with minimal exertion, severe fatigue, and feel admission for further workup for CHF and treatment of possible pneumonia is appropriate. Patient was given vancomycin and cefepime for age In setting of hospitalization 3 months ago. She was given 40 mg of IV Lasix, potassium, with increase in her urine output.   Family preference was to be admitted to [facility], however after calling them, they've been told that there are no telemetry beds available. Patient to be admitted to a [facility], and admitted to [facility].  Final Clinical Impressions(s) / ED Diagnoses   Final diagnoses:  Acute on chronic congestive heart failure, unspecified heart failure type (HCC)  HCAP (healthcare-associated pneumonia)    New Prescriptions New Prescriptions   No medications on file     Alvira Monday, MD 11/05/16 1727

## 2016-11-05 NOTE — ED Notes (Signed)
Bedside commode placed at bedside.  

## 2016-11-05 NOTE — Progress Notes (Signed)
PHARMACY NOTE:  ANTIMICROBIAL RENAL DOSAGE ADJUSTMENT  Current antimicrobial regimen includes a mismatch between antimicrobial dosage and estimated renal function.  As per policy approved by the Pharmacy & Therapeutics and Medical Executive Committees, the antimicrobial dosage will be adjusted accordingly.  Current antimicrobial dosage:  Cefepime 1gm IV q8h  Indication: HCAP  Renal Function:  Estimated Creatinine Clearance: 45.2 mL/min (by C-G formula based on SCr of 0.67 mg/dL). []      On intermittent HD, scheduled: []      On CRRT    Antimicrobial dosage has been changed to:  1gm Q12h  Additional comments:   Thank you for allowing pharmacy to be a part of this patient's care.  Arley PhenixEllen Jeovany Huitron RPh 11/05/2016, 7:25 PM Pager [phone]

## 2016-11-05 NOTE — Progress Notes (Addendum)
[facility] and ? HCAP (was recently admitted for cholecystectomy). Pt with two months of cough but worse in the past week, last week saw PCP and was started on Cefuroxime for ? PNA by her PCP (started treatment yesterday), worse despite ABX treatment. Needs tele bed.   Carolyn PrestoMAGICK-Amaiah Cristiano, MD  [facility] Pager [phone]  If 7PM-7AM, please contact night-coverage [email or web address] Password [email or web address]

## 2016-11-05 NOTE — Progress Notes (Signed)
Pharmacy Antibiotic Note  Carolyn Patton is a 83 y.o. female admitted on 11/05/2016 with pneumonia.  Pharmacy has been consulted for vancomycin dosing. Patient receiving Cefepime 1g x1.   SCr 0.67, CrCl ~ 45 mL/min.  Tmax 99.4, WBC pending.   Plan: Vancomycin 1000 mg IV x1 then 500 mg IV every 12 hours.  Monitor renal function, culture results, and clinical status.  Follow-up if Cefepime is to be continued.    Height: 5\' 4"  (162.6 cm) Weight: 134 lb (60.8 kg) IBW/kg (Calculated) : 54.7  Temp (24hrs), Avg:99.4 F (37.4 C), Min:99.4 F (37.4 C), Max:99.4 F (37.4 C)   Recent Labs Lab 11/05/16 1201  CREATININE 0.67    Estimated Creatinine Clearance: 45.2 mL/min (by C-G formula based on SCr of 0.67 mg/dL).    Allergies  Allergen Reactions  . Hydralazine Other (See Comments)    Low blood pressure  . Sulfa Antibiotics Itching    Antimicrobials this admission: Vancomycin 5/12 >> Cefepime 5/12 x1  Dose adjustments this admission:   Microbiology results: 5/12 BCx:   Thank you for allowing pharmacy to be a part of this patient's care.  Link SnufferJessica Viola Kinnick, PharmD, BCPS Clinical Pharmacist Clinical Phone 11/05/2016 until 3:30 PM- [phone] After hours, please call #[phone] 11/05/2016 1:20 PM

## 2016-11-05 NOTE — ED Notes (Signed)
Family upset about where pt is being transported and when. RN made aware.

## 2016-11-05 NOTE — ED Notes (Signed)
Pt on cardiac monitor and auto VS 

## 2016-11-05 NOTE — ED Notes (Signed)
ED Provider at bedside. 

## 2016-11-05 NOTE — ED Notes (Signed)
[facility] arrived to transport pt at this time.  

## 2016-11-06 ENCOUNTER — Inpatient Hospital Stay (HOSPITAL_COMMUNITY): Payer: Medicare Other

## 2016-11-06 DIAGNOSIS — I360 Nonrheumatic tricuspid (valve) stenosis: Secondary | ICD-10-CM

## 2016-11-06 LAB — EXPECTORATED SPUTUM ASSESSMENT W REFEX TO RESP CULTURE

## 2016-11-06 LAB — BASIC METABOLIC PANEL
ANION GAP: 10 (ref 5–15)
BUN: 8 mg/dL (ref 6–20)
CO2: 26 mmol/L (ref 22–32)
Calcium: 8.5 mg/dL — ABNORMAL LOW (ref 8.9–10.3)
Chloride: 105 mmol/L (ref 101–111)
Creatinine, Ser: 0.86 mg/dL (ref 0.44–1.00)
GFR calc Af Amer: >60 mL/min (ref >60)
GLUCOSE: 103 mg/dL — AB (ref 65–99)
POTASSIUM: 3.4 mmol/L — AB (ref 3.5–5.1)
Sodium: 141 mmol/L (ref 135–145)

## 2016-11-06 LAB — URINALYSIS, ROUTINE W REFLEX MICROSCOPIC
Bilirubin Urine: NEGATIVE
GLUCOSE, UA: NEGATIVE mg/dL
Hgb urine dipstick: NEGATIVE
Ketones, ur: NEGATIVE mg/dL
LEUKOCYTES UA: NEGATIVE
Nitrite: NEGATIVE
PH: 7 (ref 5.0–8.0)
PROTEIN: NEGATIVE mg/dL
Specific Gravity, Urine: 1.009 (ref 1.005–1.030)

## 2016-11-06 LAB — CBC
HCT: 38.9 % (ref 36.0–46.0)
HEMOGLOBIN: 12.8 g/dL (ref 12.0–15.0)
MCH: 29.1 pg (ref 26.0–34.0)
MCHC: 32.9 g/dL (ref 30.0–36.0)
MCV: 88.4 fL (ref 78.0–100.0)
Platelets: 228 10*3/uL (ref 150–400)
RBC: 4.4 MIL/uL (ref 3.87–5.11)
RDW: 14.3 % (ref 11.5–15.5)
WBC: 11.6 10*3/uL — ABNORMAL HIGH (ref 4.0–10.5)

## 2016-11-06 LAB — ECHOCARDIOGRAM COMPLETE
HEIGHTINCHES: 64 in
Weight: 2045.87 oz

## 2016-11-06 LAB — TROPONIN I: TROPONIN I: 0.04 ng/mL — AB (ref <0.03)

## 2016-11-06 LAB — STREP PNEUMONIAE URINARY ANTIGEN: Strep Pneumo Urinary Antigen: NEGATIVE

## 2016-11-06 LAB — T4, FREE: FREE T4: 1.82 ng/dL — AB (ref 0.61–1.12)

## 2016-11-06 LAB — EXPECTORATED SPUTUM ASSESSMENT W GRAM STAIN, RFLX TO RESP C

## 2016-11-06 MED ORDER — DM-GUAIFENESIN ER 30-600 MG PO TB12
1.0000 | ORAL_TABLET | Freq: Two times a day (BID) | ORAL | Status: DC
  Administered 2016-11-06 – 2016-11-08 (×5): 1 via ORAL
  Filled 2016-11-06 (×5): qty 1

## 2016-11-06 MED ORDER — HYDROCODONE-HOMATROPINE 5-1.5 MG/5ML PO SYRP
5.0000 mL | ORAL_SOLUTION | Freq: Every evening | ORAL | Status: DC | PRN
  Administered 2016-11-06 – 2016-11-07 (×2): 5 mL via ORAL
  Filled 2016-11-06 (×2): qty 5

## 2016-11-06 NOTE — Progress Notes (Addendum)
Patient arrived to unit. Patient A&O x 4, denies pain, VS stable. Patient denies complaints at this time. [facility] admissions paged for admission orders.  Earnest ConroyBrooke M. Clelia CroftShaw, RN

## 2016-11-06 NOTE — Progress Notes (Signed)
  Echocardiogram 2D Echocardiogram has been performed.  Carolyn Patton, Carolyn Patton 11/06/2016, 11:46 AM

## 2016-11-06 NOTE — Progress Notes (Signed)
PROGRESS NOTE    Carolyn Patton  ZOX:[id number] DOB: [birth year] DOA: 11/05/2016 PCP: Forrest Moron, MD  Outpatient Specialists:     Brief Narrative:  83  Chr LBP/Shoulder pain ? Tensions headaches Palpitations Insomnia MI in 2012 with 3 stetns to LAD   s/p DES stent 08/2015 priro afib s/p ablation ion Amiodarone Easy fatiguability  Admitted in a settign of recent diagnosis of HCAP and was started as OP on Cefuroxime by pcp On admit sodium 141, k 3.4 poc slight elevation 0.04 Wbc 11.6 cxr pathcy LUL opacity   Assessment & Plan:   Principal Problem:   HCAP (healthcare-associated pneumonia) Active Problems:   Essential hypertension   CAD in native artery   HLD (hyperlipidemia)   Fatigue   Dyspnea   Acute on chronic congestive heart failure (HCC)   Decreased appetite   GERD (gastroesophageal reflux disease)   Hiatal hernia   HCAP Cont cefepime and Vanc Narrow soon Might need work-up for aspiration depending on scenario--does not cough while eating per family  ? AECHF Echo pending Continue Lasix 40 Iv daily Decision re: home use once echo back  CHr Fatigue/Headaches MRI from 03/2015 wnl except chr microvasc changes Continue Baclofen 5 tid, flexeril 5 hs. Tramadol 50 q 12 Can use Ativan 0.25 tid anxiety/sleep OP management neurologist Dr. Epimenio Foot  CAD-stent 2012/rpt stenting 2017 Continue for now ASa 81, plavix 75, coreg 12.5 bid  Afib, Chad2vasc2 ~4, s/p Ablation, on chr Amiodarone Cont amio for rythmn control200 daily Coreg as above Monitor overnight on tele and d/c if stable  Htn Controlled currently Losartan 50 qd  hld Cont lipitor 10 daily  Recent Cholecystitis and cholecystectomy with slow recovery  inpatient pending resolution D/w family Full code  Consultants:   noen yet  Procedures:   none  Antimicrobials:   Cefdinir as OP  Cefepime 5/12  Vanc 5/12    Subjective:  Alert oriented in nad Coughing still-at night got  relief from suppressant Not expectorating No chills nor rigors  Objective: Vitals:   11/05/16 1824 11/05/16 2038 11/05/16 2227 11/06/16 0525  BP: (!) 149/83 139/74 (!) 113/52 106/76  Pulse: 79 80  87  Resp: 18 18  18   Temp: 98.4 F (36.9 C) 98.7 F (37.1 C)  98.4 F (36.9 C)  TempSrc: Oral Oral  Oral  SpO2: 94% 96%  98%  Weight: 58.3 kg (128 lb 8.5 oz)   58 kg (127 lb 13.9 oz)  Height: 5\' 4"  (1.626 m)       Intake/Output Summary (Last 24 hours) at 11/06/16 0808 Last data filed at 11/06/16 0600  Gross per 24 hour  Intake              590 ml  Output             1700 ml  Net            -1110 ml   Filed Weights   11/05/16 1025 11/05/16 1824 11/06/16 0525  Weight: 60.8 kg (134 lb) 58.3 kg (128 lb 8.5 oz) 58 kg (127 lb 13.9 oz)    Examination:  Frail pleasant in nad o2 sat 96 on RA No rales nor rhinchi abd soft No le edema s1 s2 rrr No le edema nor rash    Data Reviewed: I have personally reviewed following labs and imaging studies  CBC:  Recent Labs Lab 11/05/16 1350 11/06/16 0137  WBC 9.9 11.6*  NEUTROABS 7.2  --   HGB 11.5* 12.8  HCT  34.4* 38.9  MCV 88.9 88.4  PLT 204 228   Basic Metabolic Panel:  Recent Labs Lab 11/05/16 1201 11/06/16 0137  NA 139 141  K 3.0* 3.4*  CL 104 105  CO2 26 26  GLUCOSE 101* 103*  BUN 8 8  CREATININE 0.67 0.86  CALCIUM 8.7* 8.5*   GFR: Estimated Creatinine Clearance: 42 mL/min (by C-G formula based on SCr of 0.86 mg/dL). Liver Function Tests:  Recent Labs Lab 11/05/16 1201  AST 33  ALT 44  ALKPHOS 90  BILITOT 0.9  PROT 7.6  ALBUMIN 3.3*   No results for input(s): LIPASE, AMYLASE in the last 168 hours. No results for input(s): AMMONIA in the last 168 hours. Coagulation Profile:  Recent Labs Lab 11/05/16 1937  INR 1.20   Cardiac Enzymes:  Recent Labs Lab 11/05/16 1122 11/05/16 1937 11/06/16 0137  TROPONINI <0.03 <0.03 0.04*   BNP (last 3 results) No results for input(s): PROBNP in the last  8760 hours. HbA1C: No results for input(s): HGBA1C in the last 72 hours. CBG: No results for input(s): GLUCAP in the last 168 hours. Lipid Profile: No results for input(s): CHOL, HDL, LDLCALC, TRIG, CHOLHDL, LDLDIRECT in the last 72 hours. Thyroid Function Tests:  Recent Labs  11/05/16 1937  TSH 0.368  FREET4 1.82*   Anemia Panel: No results for input(s): VITAMINB12, FOLATE, FERRITIN, TIBC, IRON, RETICCTPCT in the last 72 hours. Urine analysis:    Component Value Date/Time   COLORURINE YELLOW 11/06/2016 0120   APPEARANCEUR CLEAR 11/06/2016 0120   LABSPEC 1.009 11/06/2016 0120   PHURINE 7.0 11/06/2016 0120   GLUCOSEU NEGATIVE 11/06/2016 0120   HGBUR NEGATIVE 11/06/2016 0120   BILIRUBINUR NEGATIVE 11/06/2016 0120   KETONESUR NEGATIVE 11/06/2016 0120   PROTEINUR NEGATIVE 11/06/2016 0120   NITRITE NEGATIVE 11/06/2016 0120   LEUKOCYTESUR NEGATIVE 11/06/2016 0120   Sepsis Labs: @LABRCNTIP (procalcitonin:4,lacticidven:4)  )No results found for this or any previous visit (from the past 240 hour(s)).   Radiology Studies: Dg Chest 2 View  Result Date: 11/05/2016 CLINICAL DATA:  83 year old female with productive cough following endoscopy and swallowing test EXAM: CHEST  2 VIEW COMPARISON:  Prior chest x-ray 11/03/2016 FINDINGS: Stable cardiac and mediastinal contours with borderline cardiomegaly. There is a background of diffuse chronic bronchitic change and mild interstitial prominence. Superimposed on this there is patchy airspace opacity in the left upper lobe which is new. No pneumothorax or pleural effusion. No acute osseous abnormality. Degenerative osteoarthritis at the left glenohumeral joint. IMPRESSION: 1. Patchy left upper lobe airspace opacity concerning for bronchopneumonia, or less likely aspiration. 2. Stable background chronic bronchitic changes and interstitial prominence. 3. Borderline cardiomegaly. Electronically Signed   By: Malachy MoanHeath  McCullough M.D.   On: 11/05/2016  12:08        Scheduled Meds: . amiodarone  200 mg Oral Daily  . aspirin  81 mg Oral Daily  . atorvastatin  10 mg Oral Daily  . carvedilol  12.5 mg Oral BID WC  . clopidogrel  75 mg Oral Q breakfast  . enoxaparin (LOVENOX) injection  40 mg Subcutaneous Q24H  . feeding supplement  1 Container Oral TID BM  . losartan  50 mg Oral Daily  . potassium chloride  40 mEq Oral BID   Continuous Infusions: . ceFEPime (MAXIPIME) IV    . ceFEPime (MAXIPIME) IV Stopped (11/06/16 0157)  . vancomycin Stopped (11/06/16 0230)     LOS: 1 day    Time spent:  5835    Pleas KochJai Adryen Cookson, MD [facility] (P) [phone]   If 7PM-7AM, please contact night-coverage [email or web address] Password [email or web address] 11/06/2016, 8:08 AM

## 2016-11-07 ENCOUNTER — Encounter (HOSPITAL_COMMUNITY): Payer: Self-pay

## 2016-11-07 LAB — CBC WITH DIFFERENTIAL/PLATELET
BASOS PCT: 0 %
Basophils Absolute: 0 10*3/uL (ref 0.0–0.1)
EOS ABS: 0.2 10*3/uL (ref 0.0–0.7)
EOS PCT: 2 %
HCT: 35.9 % — ABNORMAL LOW (ref 36.0–46.0)
HEMOGLOBIN: 11.8 g/dL — AB (ref 12.0–15.0)
LYMPHS ABS: 2.1 10*3/uL (ref 0.7–4.0)
Lymphocytes Relative: 23 %
MCH: 28.9 pg (ref 26.0–34.0)
MCHC: 32.9 g/dL (ref 30.0–36.0)
MCV: 88 fL (ref 78.0–100.0)
MONOS PCT: 11 %
Monocytes Absolute: 1 10*3/uL (ref 0.1–1.0)
NEUTROS PCT: 64 %
Neutro Abs: 5.7 10*3/uL (ref 1.7–7.7)
PLATELETS: 226 10*3/uL (ref 150–400)
RBC: 4.08 MIL/uL (ref 3.87–5.11)
RDW: 14.3 % (ref 11.5–15.5)
WBC: 9 10*3/uL (ref 4.0–10.5)

## 2016-11-07 LAB — BASIC METABOLIC PANEL
Anion gap: 8 (ref 5–15)
BUN: 10 mg/dL (ref 6–20)
CALCIUM: 8.6 mg/dL — AB (ref 8.9–10.3)
CHLORIDE: 106 mmol/L (ref 101–111)
CO2: 23 mmol/L (ref 22–32)
CREATININE: 0.69 mg/dL (ref 0.44–1.00)
GFR calc non Af Amer: >60 mL/min (ref >60)
Glucose, Bld: 116 mg/dL — ABNORMAL HIGH (ref 65–99)
Potassium: 4.3 mmol/L (ref 3.5–5.1)
SODIUM: 137 mmol/L (ref 135–145)

## 2016-11-07 LAB — LEGIONELLA PNEUMOPHILA SEROGP 1 UR AG: L. pneumophila Serogp 1 Ur Ag: NEGATIVE

## 2016-11-07 MED ORDER — SACUBITRIL-VALSARTAN 24-26 MG PO TABS
1.0000 | ORAL_TABLET | Freq: Two times a day (BID) | ORAL | Status: DC
  Administered 2016-11-07 – 2016-11-08 (×2): 1 via ORAL
  Filled 2016-11-07 (×2): qty 1

## 2016-11-07 MED ORDER — CEFDINIR 125 MG/5ML PO SUSR
300.0000 mg | Freq: Two times a day (BID) | ORAL | Status: DC
  Administered 2016-11-07 – 2016-11-08 (×2): 300 mg via ORAL
  Filled 2016-11-07 (×2): qty 15

## 2016-11-07 MED ORDER — RESOURCE INSTANT PROTEIN PO PWD PACKET
1.0000 | Freq: Three times a day (TID) | ORAL | Status: DC
  Administered 2016-11-08: 6 g via ORAL
  Filled 2016-11-07 (×4): qty 6

## 2016-11-07 MED ORDER — BENEPROTEIN PO POWD
1.0000 | Freq: Three times a day (TID) | ORAL | Status: DC
  Filled 2016-11-07: qty 227

## 2016-11-07 NOTE — Progress Notes (Signed)
Initial Nutrition Assessment  DOCUMENTATION CODES:   Non-severe (moderate) malnutrition in context of acute illness/injury  INTERVENTION:   -Continue Boost Breeze po TID, each supplement provides 250 kcal and 9 grams of protein -Provide Beneprotein packet TID w/ meals, each provides 25 kcal and 6g protein -Encourage PO intake -RD to continue to monitor  NUTRITION DIAGNOSIS:   Malnutrition (Moderate) related to acute illness, poor appetite as evidenced by energy intake < or equal to 75% for > or equal to 1 month, energy intake < 75% for > 7 days, moderate depletion of body fat, moderate depletions of muscle mass.  GOAL:   Patient will meet greater than or equal to 90% of their needs  MONITOR:   PO intake, Supplement acceptance, Labs, Weight trends, I & O's  REASON FOR ASSESSMENT:   Consult Assessment of nutrition requirement/status  ASSESSMENT:   83 y.o. woman with a history of CAD S/P multiple stents, prior MI, HTN, GERD, hiatal hernia, hypothyroidism, and atrial fibrillation (S/P ablation, maintained on amiodarone but no anticoagulation, CHADS-Vasc score 5) who had laparoscopic cholecystectomy in early March at an outside facility.  She required repeat admission for post-operative ileus.  Since then, she has had a dry cough that has become productive to yellow to brown sputum over the past 24 hours.  She reports subjective fevers with chills and sweats.  She has had shortness of breath at rest and dyspnea on exertion.  She reports increased fatigue and weakness.  She has intermittent, fleeting substernal chest pain that she does not relate to cough or exertion.  It is not pleuritic.  She has had palpitations but no syncope.  No weight gain.  She has had intermittent swelling that has been confined to her feet.  She denies orthopnea or PND to me.  Patient in room with no family at bedside. Pt reports having a poor appetite since her gallbladder surgery in March 2018. Pt states that  her abdominal pain went away after surgery but her appetite never recovered. Pt states she ate a piece of [place] sausage this morning for breakfast and a few bites of pancakes. Per RN, pt ate 50% of her breakfast. Pt states she does not feel hungry for lunch but will order anyway. Reviewed different protein supplement options with patient and she decided to continue Boost Breeze supplements and to try Beneprotein supplements with meals. Pt does not tolerate milk and milky supplements.  Per chart review, pt has lost 13 lb since November 2017 (9% wt loss x 6 months, insignificant for time frame). Pt's weight has been trending down since 2016.  Nutrition-Focused physical exam completed. Findings are moderate fat depletion, moderate muscle depletion, and no edema.   Labs reviewed. Medications reviewed.  Diet Order:  Diet Heart Room service appropriate? Yes; Fluid consistency: Thin  Skin:  Reviewed, no issues  Last BM:  5/12  Height:   Ht Readings from Last 1 Encounters:  11/05/16 5\' 4"  (1.626 m)    Weight:   Wt Readings from Last 1 Encounters:  11/07/16 129 lb 10.1 oz (58.8 kg)    Ideal Body Weight:  54.5 kg  BMI:  Body mass index is 22.25 kg/m.  Estimated Nutritional Needs:   Kcal:  1500-1700  Protein:  70-80g  Fluid:  1.5L/day  EDUCATION NEEDS:   Education needs addressed  Tilda FrancoLindsey Julee Stoll, MS, RD, LDN Pager: [phone] After Hours Pager: [phone]

## 2016-11-07 NOTE — Progress Notes (Addendum)
PROGRESS NOTE    Carolyn Patton  ZOX:[id number] DOB: Apr 29, 1930 DOA: 11/05/2016 PCP: Forrest Moron, MD  Outpatient Specialists:     Brief Narrative:  86  Chr LBP/Shoulder pain ? Tensions headaches Palpitations Insomnia MI in 2012 with 3 stetns to LAD   s/p DES stent 08/2015 priro afib s/p ablation ion Amiodarone Easy fatiguability  Admitted in a settign of recent diagnosis of HCAP and was started as OP on Cefuroxime by pcp On admit sodium 141, k 3.4 poc slight elevation 0.04 Wbc 11.6 cxr pathcy LUL opacity   Assessment & Plan:   Principal Problem:   HCAP (healthcare-associated pneumonia) Active Problems:   Essential hypertension   CAD in native artery   HLD (hyperlipidemia)   Fatigue   Dyspnea   Acute on chronic congestive heart failure (HCC)   Decreased appetite   GERD (gastroesophageal reflux disease)   Hiatal hernia   HCAP Cont cefepime and Vanc Narrow soon Might need work-up for aspiration depending on scenario--does not cough while eating per family  Acute systolicexacerbation CHF Continue Lasix 40 Iv daily for now See below  CHr Fatigue/Headaches MRI from 03/2015 wnl except chr microvasc changes Continue Baclofen 5 tid, flexeril 5 hs. Tramadol 50 q 12 Can use Ativan 0.25 tid anxiety/sleep OP management neurologist Dr. Epimenio Foot  CAD-stent 2012/rpt stenting 2017  Continue for now ASa 81, plavix 75, coreg 12.5 bid Echo shows EF 20% Review of Myocardial stress from 08/2015 shows EF 45%--[facility] discussion with dr. Rudolpho Sevin nurse Cardiology dr. Algie Coffer consulted and will see  Dr Jerrell Mylar nurse as well who has indicated patient can have follow-up appt as OP  Afib, Chad2vasc2 ~4, s/p Ablation, on chr Amiodarone Cont amio for rythmn control200 daily Coreg as above Monitor overnight on tele and d/c if stable  Htn Controlled currently Losartan 50 qd  hld Cont lipitor 10 daily  Recent Cholecystitis and  cholecystectomy with slow recovery  inpatient pending resolution D/w family Full code  Consultants:   noen yet  Procedures:   none  Antimicrobials:   Cefdinir as OP  Cefepime 5/12  Vanc 5/12    Subjective:  Fair No cp No n/v No sob no rad arm pain No unilat weak  Objective: Vitals:   11/06/16 2224 11/07/16 0500 11/07/16 0600 11/07/16 0700  BP: 134/71  140/76   Pulse: 67  74   Resp: (!) 22  20   Temp: 98.2 F (36.8 C)  98.8 F (37.1 C)   TempSrc: Oral  Oral   SpO2: 97%  100%   Weight:  60.6 kg (133 lb 9.6 oz)  58.8 kg (129 lb 10.1 oz)  Height:        Intake/Output Summary (Last 24 hours) at 11/07/16 1209 Last data filed at 11/07/16 0200  Gross per 24 hour  Intake              540 ml  Output                0 ml  Net              540 ml   Filed Weights   11/06/16 0525 11/07/16 0500 11/07/16 0700  Weight: 58 kg (127 lb 13.9 oz) 60.6 kg (133 lb 9.6 oz) 58.8 kg (129 lb 10.1 oz)    Examination:  Frail pleasant in nad o2 sat 96 on RA No rales nor rhonchi, no tvr, tvf abd soft No le edema s1 s2 rrr No  le edema nor rash    Data Reviewed: I have personally reviewed following labs and imaging studies  CBC:  Recent Labs Lab 11/05/16 1350 11/06/16 0137 11/07/16 0451  WBC 9.9 11.6* 9.0  NEUTROABS 7.2  --  5.7  HGB 11.5* 12.8 11.8*  HCT 34.4* 38.9 35.9*  MCV 88.9 88.4 88.0  PLT 204 228 226   Basic Metabolic Panel:  Recent Labs Lab 11/05/16 1201 11/06/16 0137 11/07/16 0451  NA 139 141 137  K 3.0* 3.4* 4.3  CL 104 105 106  CO2 26 26 23   GLUCOSE 101* 103* 116*  BUN 8 8 10   CREATININE 0.67 0.86 0.69  CALCIUM 8.7* 8.5* 8.6*   GFR: Estimated Creatinine Clearance: 45.2 mL/min (by C-G formula based on SCr of 0.69 mg/dL). Liver Function Tests:  Recent Labs Lab 11/05/16 1201  AST 33  ALT 44  ALKPHOS 90  BILITOT 0.9  PROT 7.6  ALBUMIN 3.3*   No results for input(s): LIPASE, AMYLASE in the last 168 hours. No results for  input(s): AMMONIA in the last 168 hours. Coagulation Profile:  Recent Labs Lab 11/05/16 1937  INR 1.20   Cardiac Enzymes:  Recent Labs Lab 11/05/16 1122 11/05/16 1937 11/06/16 0137 11/06/16 0727  TROPONINI <0.03 <0.03 0.04* <0.03   BNP (last 3 results) No results for input(s): PROBNP in the last 8760 hours. HbA1C: No results for input(s): HGBA1C in the last 72 hours. CBG: No results for input(s): GLUCAP in the last 168 hours. Lipid Profile: No results for input(s): CHOL, HDL, LDLCALC, TRIG, CHOLHDL, LDLDIRECT in the last 72 hours. Thyroid Function Tests:  Recent Labs  11/05/16 1937  TSH 0.368  FREET4 1.82*   Anemia Panel: No results for input(s): VITAMINB12, FOLATE, FERRITIN, TIBC, IRON, RETICCTPCT in the last 72 hours. Urine analysis:    Component Value Date/Time   COLORURINE YELLOW 11/06/2016 0120   APPEARANCEUR CLEAR 11/06/2016 0120   LABSPEC 1.009 11/06/2016 0120   PHURINE 7.0 11/06/2016 0120   GLUCOSEU NEGATIVE 11/06/2016 0120   HGBUR NEGATIVE 11/06/2016 0120   BILIRUBINUR NEGATIVE 11/06/2016 0120   KETONESUR NEGATIVE 11/06/2016 0120   PROTEINUR NEGATIVE 11/06/2016 0120   NITRITE NEGATIVE 11/06/2016 0120   LEUKOCYTESUR NEGATIVE 11/06/2016 0120   Sepsis Labs: @LABRCNTIP (procalcitonin:4,lacticidven:4)  ) Recent Results (from the past 240 hour(s))  Culture, blood (Routine X 2) w Reflex to ID Panel     Status: None (Preliminary result)   Collection Time: 11/05/16  1:30 PM  Result Value Ref Range Status   Specimen Description BLOOD LEFT ARM  Final   Special Requests   Final    BOTTLES DRAWN AEROBIC AND ANAEROBIC Blood Culture adequate volume   Culture   Final    NO GROWTH 2 DAYS Performed at [facility], [address], [place], [place] [address]    Report Status PENDING  Incomplete  Culture, blood (Routine X 2) w Reflex to ID Panel     Status: None (Preliminary result)   Collection Time: 11/05/16  1:31 PM  Result Value Ref Range Status    Specimen Description BLOOD RIGHT FOREARM  Final   Special Requests   Final    BOTTLES DRAWN AEROBIC AND ANAEROBIC Blood Culture adequate volume   Culture   Final    NO GROWTH 2 DAYS Performed at [facility], [address], [place], [place] [address]    Report Status PENDING  Incomplete  Culture, sputum-assessment     Status: None   Collection Time: 11/06/16  1:48 PM  Result Value Ref Range Status   Specimen Description SPUTUM  Final   Special Requests NONE  Final   Sputum evaluation THIS SPECIMEN IS ACCEPTABLE FOR SPUTUM CULTURE  Final   Report Status 11/06/2016 FINAL  Final  Culture, respiratory (NON-Expectorated)     Status: None (Preliminary result)   Collection Time: 11/06/16  1:48 PM  Result Value Ref Range Status   Specimen Description SPUTUM  Final   Special Requests NONE Reflexed from [id number]  Final   Gram Stain   Final    ABUNDANT WBC PRESENT, PREDOMINANTLY PMN RARE SQUAMOUS EPITHELIAL CELLS PRESENT RARE GRAM POSITIVE COCCI IN PAIRS RARE GRAM NEGATIVE RODS    Culture   Final    TOO YOUNG TO READ Performed at [facility], [address] [place], [place] [address]    Report Status PENDING  Incomplete     Radiology Studies: No results found.   Scheduled Meds: . amiodarone  200 mg Oral Daily  . aspirin  81 mg Oral Daily  . atorvastatin  10 mg Oral Daily  . carvedilol  12.5 mg Oral BID WC  . clopidogrel  75 mg Oral Q breakfast  . dextromethorphan-guaiFENesin  1 tablet Oral BID  . enoxaparin (LOVENOX) injection  40 mg Subcutaneous Q24H  . feeding supplement  1 Container Oral TID BM  . HYDROcodone-homatropine  5 mL Oral QHS,MR X 1  . losartan  50 mg Oral Daily   Continuous Infusions: . ceFEPime (MAXIPIME) IV 1 g (11/07/16 1050)  . vancomycin Stopped (11/07/16 0230)     LOS: 2 days   Time spent:  2935    Pleas KochJai Charlesetta Milliron, MD [facility](P) [phone]   If 7PM-7AM, please contact night-coverage [email or web address] Password [email or web address] 11/07/2016,  12:09 PM

## 2016-11-07 NOTE — Evaluation (Signed)
Physical Therapy Evaluation Patient Details Name: Carolyn Patton MRN: [id number] DOB: 1929/06/30 Today's Date: 11/07/2016   History of Present Illness  Carolyn Patton is a 83 y.o. woman with a history of CAD S/P multiple stents, prior MI, HTN, GERD, hiatal hernia, hypothyroidism, and atrial fibrillation--S/P ablation  Clinical Impression  Patient evaluated by Physical Therapy with no further acute PT needs identified. All education has been completed and the patient has no further questions.  See below for any follow-up Physical Therapy or equipment needs. PT is signing off. Thank you for this referral.  Encouraged pt to continue amb as tol with nursing staff; advised pt to use her RW or cane at home as needed, although amb today with PT without AD--agreeable to no AD after encouragement from PT, no LOB; pt is quite independent at baseline and enjoys working in the yard   Follow Up Recommendations No PT follow up    Equipment Recommendations  None recommended by PT    Recommendations for Other Services       Precautions / Restrictions Precautions Precautions: Fall Restrictions Weight Bearing Restrictions: No      Mobility  Bed Mobility Overal bed mobility: Independent                Transfers Overall transfer level: Modified independent               General transfer comment: incr time, no physical assist, bed at lowest setting  Ambulation/Gait Ambulation/Gait assistance: Supervision Ambulation Distance (Feet): 400 Feet Assistive device: None (IV pole partial distance) Gait Pattern/deviations: Step-through pattern     General Gait Details: supervision for safety only/IV pole push; no LOB, no physical assist  Stairs            Wheelchair Mobility    Modified Rankin (Stroke Patients Only)       Balance     Sitting balance-Leahy Scale: Normal       Standing balance-Leahy Scale: Good               High level balance activites:  Side stepping;Turns;Head turns High Level Balance Comments: no LOB alhtough pt guarded initially             Pertinent Vitals/Pain Pain Assessment: No/denies pain    Home Living Family/patient expects to be discharged to:: Private residence Living Arrangements: Spouse/significant other Available Help at Discharge: Family Type of Home: House       Home Layout: One level Home Equipment: Environmental consultantWalker - 2 wheels;Cane - single point;Bedside commode      Prior Function Level of Independence: Independent               Hand Dominance        Extremity/Trunk Assessment   Upper Extremity Assessment Upper Extremity Assessment: Overall WFL for tasks assessed    Lower Extremity Assessment Lower Extremity Assessment: Overall WFL for tasks assessed       Communication   Communication: No difficulties  Cognition Arousal/Alertness: Awake/alert Behavior During Therapy: WFL for tasks assessed/performed Overall Cognitive Status: Within Functional Limits for tasks assessed                                        General Comments      Exercises     Assessment/Plan    PT Assessment Patent does not need any further PT services  PT Problem List  PT Treatment Interventions      PT Goals (Current goals can be found in the Care Plan section)  Acute Rehab PT Goals PT Goal Formulation: All assessment and education complete, DC therapy    Frequency     Barriers to discharge        Co-evaluation               AM-PAC PT "6 Clicks" Daily Activity  Outcome Measure Difficulty turning over in bed (including adjusting bedclothes, sheets and blankets)?: None Difficulty moving from lying on back to sitting on the side of the bed? : None Difficulty sitting down on and standing up from a chair with arms (e.g., wheelchair, bedside commode, etc,.)?: None Help needed moving to and from a bed to chair (including a wheelchair)?: None Help needed walking  in hospital room?: A Little Help needed climbing 3-5 steps with a railing? : A Little 6 Click Score: 22    End of Session   Activity Tolerance: Patient tolerated treatment well Patient left: in bed;with call bell/phone within reach   PT Visit Diagnosis: Difficulty in walking, not elsewhere classified (R26.2)    Time: 4098-1191 PT Time Calculation (min) (ACUTE ONLY): 14 min   Charges:   PT Evaluation $PT Eval Low Complexity: 1 Procedure     PT G CodesDrucilla Chalet, PT Pager: [phone] 11/07/2016   [facility] 11/07/2016, 3:30 PM

## 2016-11-07 NOTE — H&P (Signed)
Referring Physician:  [name] is an 83 y.o. female.                       Chief Complaint: Chest pain and shortness of breath.  HPI: 83 year old female with PMH of CAD, Stent  Placement x 2, Catheter ablation for atrial fibrillation, has shortness of breath with exertion and chest pain. She is treated for pneumonia at the present time. Her echocardiogram shows severe LV systolic dysfunction. She has elevated BNP of 1105.6 and near normal Troponin-I. EKG showed sinus rhythm and LBBB.  Past Medical History:  Diagnosis Date  . Coronary artery disease   . Hiatal hernia   . Hypertension   . MI, old 2012  . Reflux esophagitis   . Thyroid disease   . Vision abnormalities       Past Surgical History:  Procedure Laterality Date  . ABDOMINAL HYSTERECTOMY    . BIOPSY THYROID    . CARDIAC CATHETERIZATION     3 stents  . CARDIAC ELECTROPHYSIOLOGY MAPPING AND ABLATION    . JOINT REPLACEMENT    . REPLACEMENT TOTAL KNEE BILATERAL      Family History  Problem Relation Age of Onset  . Stroke Mother   . Heart disease Father   . Pneumonia Father    Social History:  reports that she has never smoked. She has never used smokeless tobacco. She reports that she does not drink alcohol or use drugs.  Allergies:  Allergies  Allergen Reactions  . Hydralazine Other (See Comments)    Low blood pressure  . Sulfa Antibiotics Itching    Medications Prior to Admission  Medication Sig Dispense Refill  . ALPRAZolam (NIRAVAM) 0.25 MG dissolvable tablet Take 0.25 mg by mouth 3 (three) times daily as needed for anxiety or sleep.     Marland Kitchen amiodarone (PACERONE) 200 MG tablet Take 200 mg by mouth daily.  6  . aspirin 81 MG tablet Take 81 mg by mouth daily.    Marland Kitchen atorvastatin (LIPITOR) 10 MG tablet Take 10 mg by mouth daily.  99  . B Complex Vitamins (VITAMIN-B COMPLEX) TABS Take 1 tablet by mouth daily.    . carvedilol (COREG) 12.5 MG tablet Take 12.5 mg by mouth 2 (two) times daily.  11  . cefdinir  (OMNICEF) 300 MG capsule Take 300 mg by mouth 2 (two) times daily. Started 5/10 x 10 days    . clopidogrel (PLAVIX) 75 MG tablet Take 75 mg by mouth daily. with food  2  . cyanocobalamin 500 MCG tablet Take 500 mcg by mouth daily.    . folic acid (FOLVITE) 1 MG tablet Take 1 mg by mouth daily.    Marland Kitchen HYDROcodone-homatropine (HYCODAN) 5-1.5 MG/5ML syrup Take 5 mLs by mouth every 6 (six) hours as needed for cough.    . losartan (COZAAR) 50 MG tablet Take 50 mg by mouth daily.  1  . predniSONE (DELTASONE) 5 MG tablet Take 5 mg by mouth 2 (two) times daily. Started 5/7 x 9 days    . nitroGLYCERIN (NITROSTAT) 0.4 MG SL tablet Place 0.4 mg under the tongue every 5 (five) minutes as needed for chest pain.    . [DISCONTINUED] baclofen (LIORESAL) 10 MG tablet 1/2 to 1 pill po tid (Patient not taking: Reported on 11/05/2016) 90 each 5  . [DISCONTINUED] cyclobenzaprine (FLEXERIL) 5 MG tablet Take 1 tablet (5 mg total) by mouth at bedtime. (Patient not taking: Reported on 05/03/2016) 30  tablet 3  . [DISCONTINUED] traMADol (ULTRAM) 50 MG tablet Take 1 tablet (50 mg total) by mouth every 12 (twelve) hours as needed. (Patient not taking: Reported on 05/03/2016) 60 tablet 3    Results for orders placed or performed during the hospital encounter of 11/05/16 (from the past 48 hour(s))  Strep pneumoniae urinary antigen     Status: None   Collection Time: 11/06/16  1:20 AM  Result Value Ref Range   Strep Pneumo Urinary Antigen NEGATIVE NEGATIVE    Comment:        Infection due to S. pneumoniae cannot be absolutely ruled out since the antigen present may be below the detection limit of the test.   Urinalysis, Routine w reflex microscopic     Status: None   Collection Time: 11/06/16  1:20 AM  Result Value Ref Range   Color, Urine YELLOW YELLOW   APPearance CLEAR CLEAR   Specific Gravity, Urine 1.009 1.005 - 1.030   pH 7.0 5.0 - 8.0   Glucose, UA NEGATIVE NEGATIVE mg/dL   Hgb urine dipstick NEGATIVE NEGATIVE    Bilirubin Urine NEGATIVE NEGATIVE   Ketones, ur NEGATIVE NEGATIVE mg/dL   Protein, ur NEGATIVE NEGATIVE mg/dL   Nitrite NEGATIVE NEGATIVE   Leukocytes, UA NEGATIVE NEGATIVE  Legionella Pneumophila Serogp 1 Ur Ag     Status: None   Collection Time: 11/06/16  1:20 AM  Result Value Ref Range   L. pneumophila Serogp 1 Ur Ag Negative Negative    Comment: (NOTE) Presumptive negative for L. pneumophila serogroup 1 antigen in urine, suggesting no recent or current infection. Legionnaires' disease cannot be ruled out since other serogroups and species may also cause disease. Performed At: [facility] [place], [place] [id number] Lindon Romp MD NH:[id number]   Basic metabolic panel     Status: Abnormal   Collection Time: 11/06/16  1:37 AM  Result Value Ref Range   Sodium 141 135 - 145 mmol/L   Potassium 3.4 (L) 3.5 - 5.1 mmol/L   Chloride 105 101 - 111 mmol/L   CO2 26 22 - 32 mmol/L   Glucose, Bld 103 (H) 65 - 99 mg/dL   BUN 8 6 - 20 mg/dL   Creatinine, Ser 0.86 0.44 - 1.00 mg/dL   Calcium 8.5 (L) 8.9 - 10.3 mg/dL   GFR calc non Af Amer >60 >60 mL/min   GFR calc Af Amer >60 >60 mL/min    Comment: (NOTE) The eGFR has been calculated using the CKD EPI equation. This calculation has not been validated in all clinical situations. eGFR's persistently <60 mL/min signify possible Chronic Kidney Disease.    Anion gap 10 5 - 15  CBC     Status: Abnormal   Collection Time: 11/06/16  1:37 AM  Result Value Ref Range   WBC 11.6 (H) 4.0 - 10.5 K/uL   RBC 4.40 3.87 - 5.11 MIL/uL   Hemoglobin 12.8 12.0 - 15.0 g/dL   HCT 38.9 36.0 - 46.0 %   MCV 88.4 78.0 - 100.0 fL   MCH 29.1 26.0 - 34.0 pg   MCHC 32.9 30.0 - 36.0 g/dL   RDW 14.3 11.5 - 15.5 %   Platelets 228 150 - 400 K/uL  Troponin I (q 6hr x 3)     Status: Abnormal   Collection Time: 11/06/16  1:37 AM  Result Value Ref Range   Troponin I 0.04 (HH) <0.03 ng/mL    Comment: CRITICAL RESULT CALLED TO, READ BACK  BY AND VERIFIED WITH: L PATRUM RN [id number] 11/06/16 A NAVARRO   Troponin I (q 6hr x 3)     Status: None   Collection Time: 11/06/16  7:27 AM  Result Value Ref Range   Troponin I <0.03 <0.03 ng/mL  Culture, sputum-assessment     Status: None   Collection Time: 11/06/16  1:48 PM  Result Value Ref Range   Specimen Description SPUTUM    Special Requests NONE    Sputum evaluation THIS SPECIMEN IS ACCEPTABLE FOR SPUTUM CULTURE    Report Status 11/06/2016 FINAL   Culture, respiratory (NON-Expectorated)     Status: None (Preliminary result)   Collection Time: 11/06/16  1:48 PM  Result Value Ref Range   Specimen Description SPUTUM    Special Requests NONE Reflexed from [id number]    Gram Stain      ABUNDANT WBC PRESENT, PREDOMINANTLY PMN RARE SQUAMOUS EPITHELIAL CELLS PRESENT RARE GRAM POSITIVE COCCI IN PAIRS RARE GRAM NEGATIVE RODS    Culture      TOO YOUNG TO READ Performed at [facility], [place] [address] [place], [place] [address]    Report Status PENDING   Basic metabolic panel     Status: Abnormal   Collection Time: 11/07/16  4:51 AM  Result Value Ref Range   Sodium 137 135 - 145 mmol/L   Potassium 4.3 3.5 - 5.1 mmol/L    Comment: REPEATED TO VERIFY DELTA CHECK NOTED NO VISIBLE HEMOLYSIS    Chloride 106 101 - 111 mmol/L   CO2 23 22 - 32 mmol/L   Glucose, Bld 116 (H) 65 - 99 mg/dL   BUN 10 6 - 20 mg/dL   Creatinine, Ser 0.69 0.44 - 1.00 mg/dL   Calcium 8.6 (L) 8.9 - 10.3 mg/dL   GFR calc non Af Amer >60 >60 mL/min   GFR calc Af Amer >60 >60 mL/min    Comment: (NOTE) The eGFR has been calculated using the CKD EPI equation. This calculation has not been validated in all clinical situations. eGFR's persistently <60 mL/min signify possible Chronic Kidney Disease.    Anion gap 8 5 - 15  CBC with Differential/Platelet     Status: Abnormal   Collection Time: 11/07/16  4:51 AM  Result Value Ref Range   WBC 9.0 4.0 - 10.5 K/uL   RBC 4.08 3.87 - 5.11 MIL/uL   Hemoglobin  11.8 (L) 12.0 - 15.0 g/dL   HCT 35.9 (L) 36.0 - 46.0 %   MCV 88.0 78.0 - 100.0 fL   MCH 28.9 26.0 - 34.0 pg   MCHC 32.9 30.0 - 36.0 g/dL   RDW 14.3 11.5 - 15.5 %   Platelets 226 150 - 400 K/uL   Neutrophils Relative % 64 %   Neutro Abs 5.7 1.7 - 7.7 K/uL   Lymphocytes Relative 23 %   Lymphs Abs 2.1 0.7 - 4.0 K/uL   Monocytes Relative 11 %   Monocytes Absolute 1.0 0.1 - 1.0 K/uL   Eosinophils Relative 2 %   Eosinophils Absolute 0.2 0.0 - 0.7 K/uL   Basophils Relative 0 %   Basophils Absolute 0.0 0.0 - 0.1 K/uL   No results found.  Review Of Systems Constitutional: No fever, chills, weight loss or gain. Eyes: No vision change, wears glasses. No discharge or pain. Ears: No hearing loss, No tinnitus. Respiratory: No asthma, COPD, pneumonias. Positive shortness of breath. No hemoptysis. Cardiovascular: Positive chest pain, palpitation, leg edema. Gastrointestinal: No nausea, vomiting, diarrhea, constipation. No GI  bleed. No hepatitis. Genitourinary: No dysuria, hematuria, kidney stone. No incontinance. Neurological: No headache, stroke, seizures.  Psychiatry: No psych facility admission for anxiety, depression, suicide. No detox. Skin: No rash. Musculoskeletal: Positive joint pain, no fibromyalgia. No neck pain, back pain. Lymphadenopathy: No lymphadenopathy. Hematology: No anemia or easy bruising.   Blood pressure 117/63, pulse 60, temperature 98.9 F (37.2 C), temperature source Oral, resp. rate 17, height 5' 4"  (1.626 m), weight 58.8 kg (129 lb 10.1 oz), SpO2 98 %. Body mass index is 22.25 kg/m. General appearance: alert, cooperative, appears stated age and no distress. Head: Normocephalic, atraumatic. Eyes: Brown eyes, pink conjunctiva, corneas clear. PERRL, EOM's intact. Neck: No adenopathy, no carotid bruit, no JVD, supple, symmetrical, trachea midline and thyroid not enlarged. Resp: Clear to auscultation bilaterally. Cardio: Regular rate and rhythm, S1, S2 normal, II/VI  systolic murmur, no click, rub or gallop GI: Soft, non-tender; bowel sounds normal; no organomegaly. Extremities: No edema, cyanosis or clubbing. Skin: Warm and dry.  Neurologic: Alert and oriented X 3, normal strength. Normal coordination and slow gait.  Assessment/Plan Pneumonia Acute on chronic left heart systolic failure CAD S/P coronary artery multiple stent placement  Hypertension H/O atrial fibrillation S/P ablation for above  Start Entresto. Discussed medical treatment v/s cardiac catheterization Patient prefers to wait for cardiac catheterization till pneumonia is better. She will see me in my office in 1 week. Continue aspirin, Plavix, amiodarone, losartan, potassium and atorvastatin.   Birdie Riddle, MD  11/07/2016, 7:48 PM

## 2016-11-08 LAB — BASIC METABOLIC PANEL
Anion gap: 8 (ref 5–15)
BUN: 9 mg/dL (ref 6–20)
CO2: 23 mmol/L (ref 22–32)
CREATININE: 0.67 mg/dL (ref 0.44–1.00)
Calcium: 8.4 mg/dL — ABNORMAL LOW (ref 8.9–10.3)
Chloride: 105 mmol/L (ref 101–111)
GFR calc Af Amer: >60 mL/min (ref >60)
GLUCOSE: 99 mg/dL (ref 65–99)
Potassium: 3.6 mmol/L (ref 3.5–5.1)
SODIUM: 136 mmol/L (ref 135–145)

## 2016-11-08 MED ORDER — SACUBITRIL-VALSARTAN 24-26 MG PO TABS: 1.0000 | ORAL_TABLET | Freq: Two times a day (BID) | ORAL | 0 refills | Status: AC

## 2016-11-08 MED ORDER — FUROSEMIDE 20 MG PO TABS: 20.0000 mg | ORAL_TABLET | ORAL | 0 refills | Status: AC

## 2016-11-08 MED ORDER — HYDROCODONE-HOMATROPINE 5-1.5 MG/5ML PO SYRP: 5.0000 mL | ORAL_SOLUTION | Freq: Every day | ORAL | 0 refills | Status: AC

## 2016-11-08 MED ORDER — DM-GUAIFENESIN ER 30-600 MG PO TB12: 1.0000 | ORAL_TABLET | Freq: Two times a day (BID) | ORAL | 0 refills | Status: AC

## 2016-11-08 MED ORDER — SACUBITRIL-VALSARTAN 24-26 MG PO TABS: 1.0000 | ORAL_TABLET | Freq: Two times a day (BID) | ORAL | Status: DC

## 2016-11-08 MED ORDER — RESOURCE INSTANT PROTEIN PO PWD PACKET: 1.0000 | Freq: Three times a day (TID) | ORAL | 0 refills | Status: AC

## 2016-11-08 MED ORDER — MEGESTROL ACETATE 40 MG PO TABS: 40.0000 mg | ORAL_TABLET | Freq: Every day | ORAL | 0 refills | Status: AC

## 2016-11-08 NOTE — Discharge Summary (Signed)
Physician Discharge Summary  DULCEMARIA BULA ZOX:[id number] DOB: Oct 26, 1929 DOA: 11/05/2016  PCP: Forrest Moron, MD  Admit date: 11/05/2016 Discharge date: 11/08/2016  Time spent: 50 minutes  Recommendations for Outpatient Follow-up:  1. Patient complete antibiotics as per primary physician recent prescription 2. Needs outpatient cardiac evaluation/cardiac catheterization given possible cardiac related as tinea and decreased EF 20% this admission-she has outpatient follow-up scheduled with cardiologist Dr. Algie Coffer 3. Needs basic metabolic panel as well as CBC in 1 month 4. Started this admission on Megace to promote diet 5. Started this admission on Lasix 20 every other day 6. Suggest TSH as an outpatient as is on chronic amiodarone  Discharge Diagnoses:  Principal Problem:   HCAP (healthcare-associated pneumonia) Active Problems:   Essential hypertension   CAD in native artery   HLD (hyperlipidemia)   Fatigue   Dyspnea   Acute on chronic congestive heart failure (HCC)   Decreased appetite   GERD (gastroesophageal reflux disease)   Hiatal hernia   Discharge Condition: Improved  Diet recommendation: Low-salt, try to fluid restrict to 1.5 L  Filed Weights   11/07/16 0500 11/07/16 0700 11/08/16 0630  Weight: 60.6 kg (133 lb 9.6 oz) 58.8 kg (129 lb 10.1 oz) 58.8 kg (129 lb 11.2 oz)    History of present illness:  83  Chr LBP/Shoulder pain ? Tensions headaches Palpitations Insomnia MI in 2012 with 3 stetns to LAD              s/p DES stent 08/2015 priro afib s/p ablation ion Amiodarone Easy fatiguability  Admitted in a settign of recent diagnosis of HCAP and was started as OP on Cefuroxime by pcp On admit sodium 141, k 3.4 poc slight elevation 0.04 Wbc 11.6 cxr pathcy LUL opacity   Hospital Course:   HCAP Cont cefepime and Vanc Narrowed on discharge back to oral cephalosporin which she should complete as discussed with her at the bedside on rounds  Acute  systolicexacerbation CHF Continue Lasix 40 Iv dailyIn hospital which was changed on discharge to 20 mg every other day  CHr Fatigue/Headaches MRI from 03/2015 wnl except chr microvasc changes Continue Baclofen 5 tid, flexeril 5 hs. Tramadol 50 q 12 Can use Ativan 0.25 tid anxiety/sleep OP management neurologist Dr. Epimenio Foot Probably has component of cardiac cachexia with an EF was 20% and at last evaluation was 40%. Scheduled for outpatient cardiac cath as above  CAD-stent 2012/rpt stenting 2017  Continue for now ASa 81, plavix 75, coreg 12.5 bid Echo shows EF 20% Review of Myocardial stress from 08/2015 shows EF 45%--no follow-up [facility] after discussion with dr. Rudolpho Sevin nurse Cardiology dr. Algie Coffer consulted and patient will have follow-up with this cardiologist in about one to 2 weeks to discuss catheterization once pneumonia has adequately been treated  Afib, Chad2vasc2 ~4, s/p Ablation, on chr Amiodarone Cont amio for rythmn control200 daily Coreg as above Monitor overnight on tele and d/c if stable  Htn Controlled currently Losartan 50 qd  hld Cont lipitor 10 daily  Moderate protein energy malnutrition Given Megace on discharge to promote diet  Recent Cholecystitis and cholecystectomy with slow recovery  Procedures: Echocardiogram 5/14 EF 20%  Consultations:  Cardiologist  Discharge Exam: Vitals:   11/07/16 2234 11/08/16 0630  BP: (!) 111/55 138/64  Pulse: 65 71  Resp: 18 18  Temp: 98.6 F (37 C) 98.3 F (36.8 C)    General: Alert wasn't joking no issues Cardiovascular: S1-S2 holosystolic murmur Respiratory: Clinically clear  Discharge Instructions  Discharge Instructions    Diet - low sodium heart healthy    Complete by:  As directed    Discharge instructions    Complete by:  As directed    Take lasix every other day for the time being.  Once you see Dr. Algie CofferKadakia, u can discuss whether it needs to continue Start entresto, a new  medicine for heart failure You will need some input from Dr. Algie CofferKadakia as an OP Please try not to drink more than 6-8 cups of 250 cc per day and decrease the salt in your diet to lower then 2 gm per day Please follow with prumary MD or Dr. Algie CofferKadakia regarding labs in 1 week Take Megace to promote appetite-I suspect controlling your heart failure and your decreased heart function will also help this   Increase activity slowly    Complete by:  As directed      Current Discharge Medication List    START taking these medications   Details  dextromethorphan-guaiFENesin (MUCINEX DM) 30-600 MG 12hr tablet Take 1 tablet by mouth 2 (two) times daily. Qty: 40 tablet, Refills: 0    furosemide (LASIX) 20 MG tablet Take 1 tablet (20 mg total) by mouth every other day. Qty: 30 tablet, Refills: 0    megestrol (MEGACE) 40 MG tablet Take 1 tablet (40 mg total) by mouth daily. Qty: 30 tablet, Refills: 0    protein supplement (RESOURCE BENEPROTEIN) 6 g POWD Take 1 scoop (6 g total) by mouth 3 (three) times daily with meals. Qty: 50 Package, Refills: 0    sacubitril-valsartan (ENTRESTO) 24-26 MG Take 1 tablet by mouth 2 (two) times daily. Qty: 60 tablet      CONTINUE these medications which have CHANGED   Details  HYDROcodone-homatropine (HYCODAN) 5-1.5 MG/5ML syrup Take 5 mLs by mouth at bedtime. Qty: 120 mL, Refills: 0      CONTINUE these medications which have NOT CHANGED   Details  ALPRAZolam (NIRAVAM) 0.25 MG dissolvable tablet Take 0.25 mg by mouth 3 (three) times daily as needed for anxiety or sleep.     amiodarone (PACERONE) 200 MG tablet Take 200 mg by mouth daily. Refills: 6    aspirin 81 MG tablet Take 81 mg by mouth daily.    atorvastatin (LIPITOR) 10 MG tablet Take 10 mg by mouth daily. Refills: 99    B Complex Vitamins (VITAMIN-B COMPLEX) TABS Take 1 tablet by mouth daily.    carvedilol (COREG) 12.5 MG tablet Take 12.5 mg by mouth 2 (two) times daily. Refills: 11    cefdinir  (OMNICEF) 300 MG capsule Take 300 mg by mouth 2 (two) times daily. Started 5/10 x 10 days    clopidogrel (PLAVIX) 75 MG tablet Take 75 mg by mouth daily. with food Refills: 2    cyanocobalamin 500 MCG tablet Take 500 mcg by mouth daily.    folic acid (FOLVITE) 1 MG tablet Take 1 mg by mouth daily.    losartan (COZAAR) 50 MG tablet Take 50 mg by mouth daily. Refills: 1    nitroGLYCERIN (NITROSTAT) 0.4 MG SL tablet Place 0.4 mg under the tongue every 5 (five) minutes as needed for chest pain.      STOP taking these medications     predniSONE (DELTASONE) 5 MG tablet        Allergies  Allergen Reactions  . Hydralazine Other (See Comments)    Low blood pressure  . Sulfa Antibiotics Itching   Follow-up Information    Orpah CobbKadakia, Ajay, MD.  Schedule an appointment as soon as possible for a visit in 1 week(s).   Specialty:  Cardiology Contact information: [address] [place] [address] [phone]        Forrest Moron, MD. Schedule an appointment as soon as possible for a visit in 2 week(s).   Specialty:  Internal Medicine Contact information: [address] [place] [address] [phone]            The results of significant diagnostics from this hospitalization (including imaging, microbiology, ancillary and laboratory) are listed below for reference.    Significant Diagnostic Studies: Dg Chest 2 View  Result Date: 11/05/2016 CLINICAL DATA:  83 year old female with productive cough following endoscopy and swallowing test EXAM: CHEST  2 VIEW COMPARISON:  Prior chest x-ray 11/03/2016 FINDINGS: Stable cardiac and mediastinal contours with borderline cardiomegaly. There is a background of diffuse chronic bronchitic change and mild interstitial prominence. Superimposed on this there is patchy airspace opacity in the left upper lobe which is new. No pneumothorax or pleural effusion. No acute osseous abnormality. Degenerative osteoarthritis at the  left glenohumeral joint. IMPRESSION: 1. Patchy left upper lobe airspace opacity concerning for bronchopneumonia, or less likely aspiration. 2. Stable background chronic bronchitic changes and interstitial prominence. 3. Borderline cardiomegaly. Electronically Signed   By: Malachy Moan M.D.   On: 11/05/2016 12:08    Microbiology: Recent Results (from the past 240 hour(s))  Culture, blood (Routine X 2) w Reflex to ID Panel     Status: None (Preliminary result)   Collection Time: 11/05/16  1:30 PM  Result Value Ref Range Status   Specimen Description BLOOD LEFT ARM  Final   Special Requests   Final    BOTTLES DRAWN AEROBIC AND ANAEROBIC Blood Culture adequate volume   Culture   Final    NO GROWTH 3 DAYS Performed at [facility], [address] [place], [place] [address]    Report Status PENDING  Incomplete  Culture, blood (Routine X 2) w Reflex to ID Panel     Status: None (Preliminary result)   Collection Time: 11/05/16  1:31 PM  Result Value Ref Range Status   Specimen Description BLOOD RIGHT FOREARM  Final   Special Requests   Final    BOTTLES DRAWN AEROBIC AND ANAEROBIC Blood Culture adequate volume   Culture   Final    NO GROWTH 3 DAYS Performed at [facility], [address], [place], [place] [address]    Report Status PENDING  Incomplete  Culture, sputum-assessment     Status: None   Collection Time: 11/06/16  1:48 PM  Result Value Ref Range Status   Specimen Description SPUTUM  Final   Special Requests NONE  Final   Sputum evaluation THIS SPECIMEN IS ACCEPTABLE FOR SPUTUM CULTURE  Final   Report Status 11/06/2016 FINAL  Final  Culture, respiratory (NON-Expectorated)     Status: None (Preliminary result)   Collection Time: 11/06/16  1:48 PM  Result Value Ref Range Status   Specimen Description SPUTUM  Final   Special Requests NONE Reflexed from [id number]  Final   Gram Stain   Final    ABUNDANT WBC PRESENT, PREDOMINANTLY PMN RARE SQUAMOUS EPITHELIAL CELLS  PRESENT RARE GRAM POSITIVE COCCI IN PAIRS RARE GRAM NEGATIVE RODS    Culture   Final    TOO YOUNG TO READ Performed at [facility], [address], [place], [place] [address]    Report Status PENDING  Incomplete     Labs: Basic Metabolic Panel:  Recent Labs Lab 11/05/16 1201 11/06/16 0137 11/07/16 0451 11/08/16 0532  NA 139 141 137 136  K 3.0* 3.4* 4.3 3.6  CL 104 105 106 105  CO2 26 26 23 23   GLUCOSE 101* 103* 116* 99  BUN 8 8 10 9   CREATININE 0.67 0.86 0.69 0.67  CALCIUM 8.7* 8.5* 8.6* 8.4*   Liver Function Tests:  Recent Labs Lab 11/05/16 1201  AST 33  ALT 44  ALKPHOS 90  BILITOT 0.9  PROT 7.6  ALBUMIN 3.3*   No results for input(s): LIPASE, AMYLASE in the last 168 hours. No results for input(s): AMMONIA in the last 168 hours. CBC:  Recent Labs Lab 11/05/16 1350 11/06/16 0137 11/07/16 0451  WBC 9.9 11.6* 9.0  NEUTROABS 7.2  --  5.7  HGB 11.5* 12.8 11.8*  HCT 34.4* 38.9 35.9*  MCV 88.9 88.4 88.0  PLT 204 228 226   Cardiac Enzymes:  Recent Labs Lab 11/05/16 1122 11/05/16 1937 11/06/16 0137 11/06/16 0727  TROPONINI <0.03 <0.03 0.04* <0.03   BNP: BNP (last 3 results)  Recent Labs  11/05/16 1122  BNP 1,105.6*    ProBNP (last 3 results) No results for input(s): PROBNP in the last 8760 hours.  CBG: No results for input(s): GLUCAP in the last 168 hours.     SignedRhetta Mura MD   [facility] 11/08/2016, 10:54 AM

## 2016-11-09 LAB — CULTURE, RESPIRATORY W GRAM STAIN: Culture: NORMAL

## 2016-11-09 LAB — CULTURE, RESPIRATORY

## 2016-11-10 LAB — CULTURE, BLOOD (ROUTINE X 2)
Culture: NO GROWTH
Culture: NO GROWTH
Special Requests: ADEQUATE
Special Requests: ADEQUATE

## 2017-05-28 IMAGING — DX DG CHEST 2V
2 series · 2 of 2 positions shown · non-contrast
Comparison: Prior chest x-ray 11/03/2016

CLINICAL DATA: 86-year-old female with productive cough following
endoscopy and swallowing test

EXAM:
CHEST  2 VIEW

[chest lat]
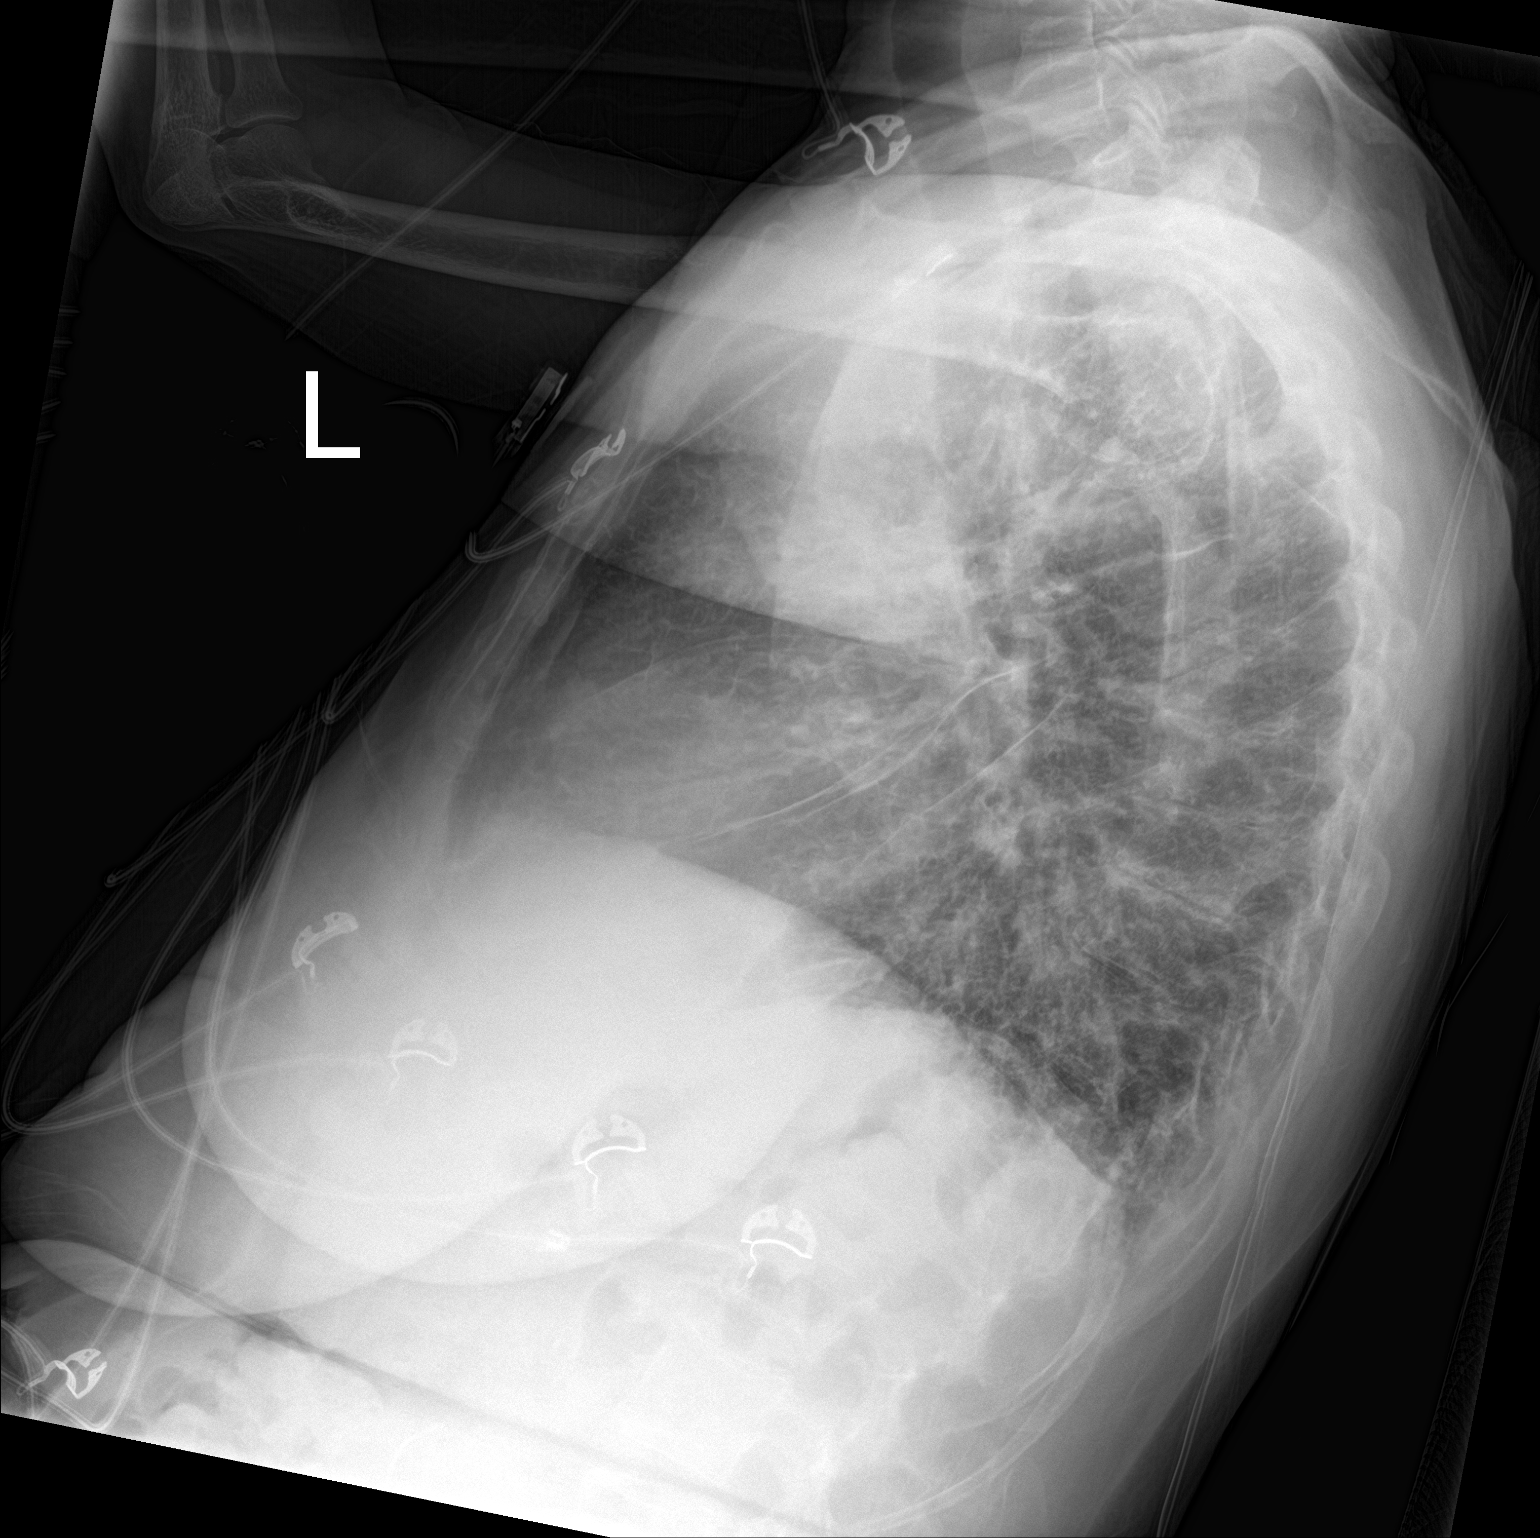

[chest ap strecther]
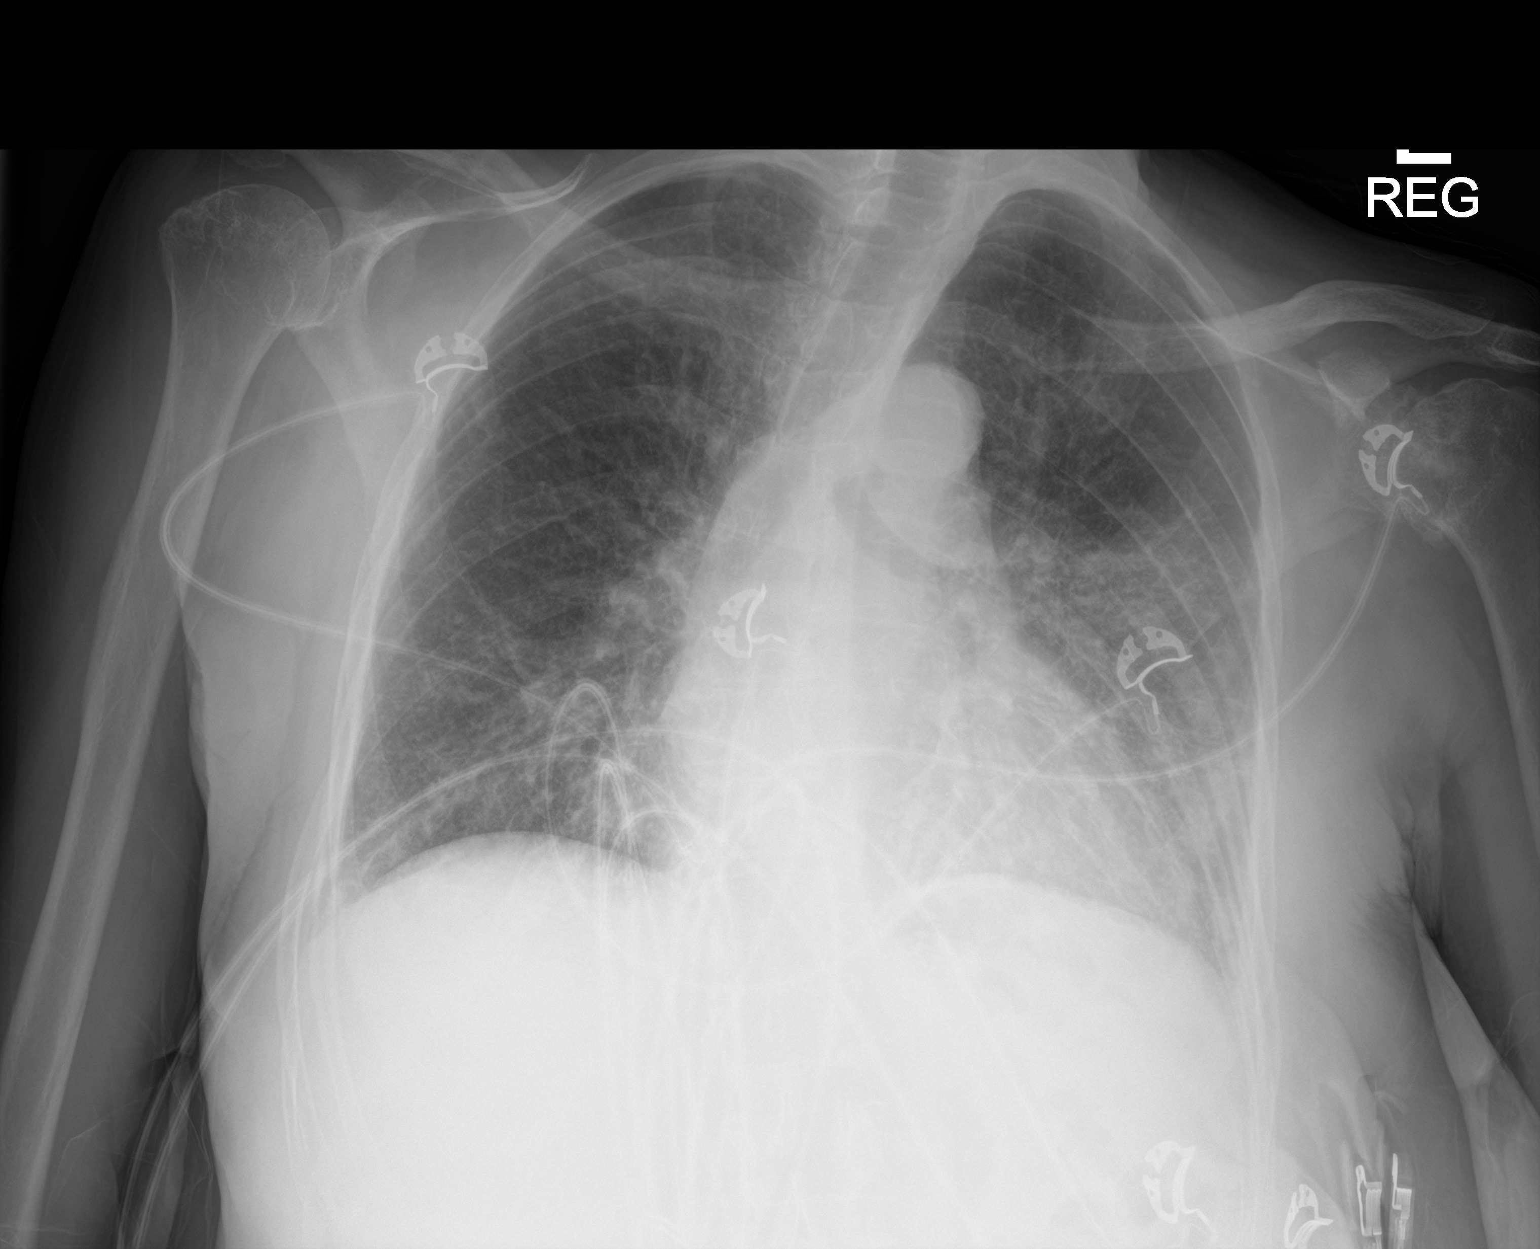

[2 of 2 positions shown; findings below may reference images not displayed]

FINDINGS: Stable cardiac and mediastinal contours with borderline
cardiomegaly. There is a background of diffuse chronic bronchitic
change and mild interstitial prominence. Superimposed on this there
is patchy airspace opacity in the left upper lobe which is new. No
pneumothorax or pleural effusion. No acute osseous abnormality.
Degenerative osteoarthritis at the left glenohumeral joint.
IMPRESSION: 1. Patchy left upper lobe airspace opacity concerning for
bronchopneumonia, or less likely aspiration.
2. Stable background chronic bronchitic changes and interstitial
prominence.
3. Borderline cardiomegaly.

## 2020-04-07 ENCOUNTER — Emergency Department (HOSPITAL_BASED_OUTPATIENT_CLINIC_OR_DEPARTMENT_OTHER): Payer: Medicare Other

## 2020-04-07 ENCOUNTER — Emergency Department (HOSPITAL_BASED_OUTPATIENT_CLINIC_OR_DEPARTMENT_OTHER)
Admission: EM | Admit: 2020-04-07 | Discharge: 2020-04-07 | Disposition: A | Discharge status: Home / Self Care | Payer: Medicare Other | Attending: Emergency Medicine | Admitting: Emergency Medicine

## 2020-04-07 ENCOUNTER — Encounter (HOSPITAL_BASED_OUTPATIENT_CLINIC_OR_DEPARTMENT_OTHER): Payer: Self-pay

## 2020-04-07 ENCOUNTER — Other Ambulatory Visit: Payer: Self-pay

## 2020-04-07 DIAGNOSIS — Z20822 Contact with and (suspected) exposure to covid-19: Secondary | ICD-10-CM | POA: Insufficient documentation

## 2020-04-07 DIAGNOSIS — Z79899 Other long term (current) drug therapy: Secondary | ICD-10-CM | POA: Diagnosis not present

## 2020-04-07 DIAGNOSIS — I5023 Acute on chronic systolic (congestive) heart failure: Secondary | ICD-10-CM | POA: Insufficient documentation

## 2020-04-07 DIAGNOSIS — E039 Hypothyroidism, unspecified: Secondary | ICD-10-CM | POA: Insufficient documentation

## 2020-04-07 DIAGNOSIS — I110 Hypertensive heart disease with heart failure: Secondary | ICD-10-CM | POA: Insufficient documentation

## 2020-04-07 DIAGNOSIS — I2510 Atherosclerotic heart disease of native coronary artery without angina pectoris: Secondary | ICD-10-CM | POA: Insufficient documentation

## 2020-04-07 DIAGNOSIS — R5383 Other fatigue: Secondary | ICD-10-CM

## 2020-04-07 DIAGNOSIS — Z7982 Long term (current) use of aspirin: Secondary | ICD-10-CM | POA: Insufficient documentation

## 2020-04-07 LAB — URINALYSIS, ROUTINE W REFLEX MICROSCOPIC
Bilirubin Urine: NEGATIVE
Glucose, UA: NEGATIVE mg/dL
Hgb urine dipstick: NEGATIVE
Ketones, ur: NEGATIVE mg/dL
Leukocytes,Ua: NEGATIVE
Nitrite: NEGATIVE
Protein, ur: NEGATIVE mg/dL
Specific Gravity, Urine: 1.01 (ref 1.005–1.030)
pH: 6.5 (ref 5.0–8.0)

## 2020-04-07 LAB — CBC
HCT: 38.1 % (ref 36.0–46.0)
Hemoglobin: 12.5 g/dL (ref 12.0–15.0)
MCH: 30.3 pg (ref 26.0–34.0)
MCHC: 32.8 g/dL (ref 30.0–36.0)
MCV: 92.5 fL (ref 80.0–100.0)
Platelets: 181 10*3/uL (ref 150–400)
RBC: 4.12 MIL/uL (ref 3.87–5.11)
RDW: 13.1 % (ref 11.5–15.5)
WBC: 5.5 10*3/uL (ref 4.0–10.5)
nRBC: 0 % (ref 0.0–0.2)

## 2020-04-07 LAB — BRAIN NATRIURETIC PEPTIDE: B Natriuretic Peptide: 464.9 pg/mL — ABNORMAL HIGH (ref 0.0–100.0)

## 2020-04-07 LAB — BASIC METABOLIC PANEL
Anion gap: 11 (ref 5–15)
BUN: 17 mg/dL (ref 8–23)
CO2: 23 mmol/L (ref 22–32)
Calcium: 9.2 mg/dL (ref 8.9–10.3)
Chloride: 105 mmol/L (ref 98–111)
Creatinine, Ser: 0.95 mg/dL (ref 0.44–1.00)
GFR, Estimated: 54 mL/min — ABNORMAL LOW (ref >60)
Glucose, Bld: 93 mg/dL (ref 70–99)
Potassium: 3.6 mmol/L (ref 3.5–5.1)
Sodium: 139 mmol/L (ref 135–145)

## 2020-04-07 LAB — RESPIRATORY PANEL BY RT PCR (FLU A&B, COVID)
Influenza A by PCR: NEGATIVE
Influenza B by PCR: NEGATIVE
SARS Coronavirus 2 by RT PCR: NEGATIVE

## 2020-04-07 LAB — TROPONIN I (HIGH SENSITIVITY)
Troponin I (High Sensitivity): 66 ng/L — ABNORMAL HIGH (ref <18)
Troponin I (High Sensitivity): 72 ng/L — ABNORMAL HIGH (ref <18)

## 2020-04-07 NOTE — ED Notes (Signed)
Spoke with son Caryn Bee per patient request. Phone number [phone]. Updated on patient status,

## 2020-04-07 NOTE — ED Triage Notes (Signed)
Pt c/o fatigue, pain to bilat UE-sx started 10/8-denies fever/flu sx-states sx are similar to when she had a MI x 2 in the past-NAD-to triage in w/c

## 2020-04-07 NOTE — ED Notes (Signed)
Review D/C papers with pt, pt states understanding, pt denies questions at this time. 

## 2020-04-07 NOTE — ED Provider Notes (Signed)
Shared visit, patient comes in with fatigue, nonspecific right sided shoulder chest pain for the last several days.  She had a heart attack earlier this year.  She follows with [facility] cardiology group.  She try to go to [facility] yesterday but the wait was too long.  She does not have any active chest pain but still has some fatigue.  She states that she did have many symptoms with her last heart attack.  Her EKG shows a sinus rhythm.  No new ischemic changes.  Troponin mildly elevated x2 at 66 and 72.  BNP is mildly elevated at 464.  Otherwise lab work is unremarkable chest x-ray unremarkable.  This could be chronic and stable disease that we are seeing on lab work.  Story is not typical for ACS however will try to touch base with her cardiology group about further recommendations.  Please see my PAs note for further results, evaluation, disposition of the patient.  This chart was dictated using voice recognition software.  Despite best efforts to proofread,  errors can occur which can change the documentation meaning.     Virgina Norfolk, DO 04/07/20 1609

## 2020-04-07 NOTE — ED Provider Notes (Addendum)
[facility] EMERGENCY DEPARTMENT Provider Note   CSN: [id number] Arrival date & time: 04/07/20  1233     History Chief Complaint  Patient presents with  . Fatigue    Carolyn Patton is a 86 y.o. female with PMH sniffing for HTN, HLD, CAD on Entresta, thyroid disease, and MI x 2 who presents the ED with a 4-day history of fatigue and bilateral upper extremity discomfort.  On my examination, patient reports that her symptoms began 04/03/2020.  She describes fatigue and mild dyspnea on exertion.  She states that her overall malaise is comparable to her history of myocardial infarctions.  She states that with her first MI, she only had fatigue symptoms.  With her second MI, she was only experiencing "stiffness" in her left and right shoulders.  During these past few days, she has been experiencing stiffness in her right shoulder and today is experiencing similar symptoms in her left shoulder and left neck.  She states that her stiffness feels to be osteoarthritic and is exacerbated by movement of upper extremities.  As for her fatigue and malaise, she is only concern for MI given her history.  She does have mild chest discomfort that has been persistent, no significant pain symptoms.  Patient is also endorsing mild urinary urgency and increased frequency, but notes that she takes diuretics.  Patient is followed by Dr. Rudolpho Sevin for cardiology.    She denies any diaphoresis, nausea vomiting, abdominal discomfort, headache, congestion or other URI symptoms, cough, numbness or weakness, room spinning dizziness, melena or hematochezia, or other changes in her bowel habits.    HPI     Past Medical History:  Diagnosis Date  . Coronary artery disease   . Hiatal hernia   . Hypertension   . MI, old 2012  . Reflux esophagitis   . Thyroid disease   . Vision abnormalities     Patient Active Problem List   Diagnosis Date Noted  . Dyspnea 11/05/2016  . HCAP (healthcare-associated  pneumonia) 11/05/2016  . Acute on chronic congestive heart failure (HCC) 11/05/2016  . Decreased appetite 11/05/2016  . GERD (gastroesophageal reflux disease) 11/05/2016  . Hiatal hernia 11/05/2016  . Hyperthyroidism 11/24/2015  . Multinodular goiter 11/24/2015  . Atrial fibrillation (HCC) 10/08/2015  . Chest pain 09/01/2015  . Bilateral sciatica 08/26/2015  . Hypotension, postural 07/03/2015  . Acquired hallux varus 03/24/2015  . Acquired flat foot 03/24/2015  . Fatigue 03/03/2015  . Awareness of heartbeats 03/03/2015  . Post concussion syndrome 02/11/2015  . Insomnia 01/14/2015  . CAD in native artery 12/12/2014  . HLD (hyperlipidemia) 12/12/2014  . Pain in left shoulder 12/12/2014  . Supraventricular tachycardia (HCC) 12/12/2014  . Essential hypertension 07/24/2014  . Heart disease 07/24/2014  . Generalized anxiety disorder 07/24/2014  . Facial pain 07/24/2014  . Pain in joint, shoulder region 07/24/2014  . Back pain 07/24/2014  . Spondylolisthesis at L5-S1 level 07/24/2014  . Neck pain 07/24/2014  . Bilateral low back pain with right-sided sciatica 07/24/2014    Past Surgical History:  Procedure Laterality Date  . ABDOMINAL HYSTERECTOMY    . BIOPSY THYROID    . CARDIAC CATHETERIZATION     3 stents  . CARDIAC DEFIBRILLATOR PLACEMENT    . CARDIAC ELECTROPHYSIOLOGY MAPPING AND ABLATION    . JOINT REPLACEMENT    . REPLACEMENT TOTAL KNEE BILATERAL       OB History   No obstetric history on file.     Family History  Problem Relation  Age of Onset  . Stroke Mother   . Heart disease Father   . Pneumonia Father     Social History   Tobacco Use  . Smoking status: Never Smoker  . Smokeless tobacco: Never Used  Vaping Use  . Vaping Use: Never used  Substance Use Topics  . Alcohol use: No    Alcohol/week: 0.0 standard drinks  . Drug use: No    Home Medications Prior to Admission medications   Medication Sig Start Date End Date Taking? Authorizing Provider    ALPRAZolam (NIRAVAM) 0.25 MG dissolvable tablet Take 0.25 mg by mouth 3 (three) times daily as needed for anxiety or sleep.     [provider]  amiodarone (PACERONE) 200 MG tablet Take 200 mg by mouth daily. 06/29/14   [provider]  aspirin 81 MG tablet Take 81 mg by mouth daily.    [provider]  atorvastatin (LIPITOR) 10 MG tablet Take 10 mg by mouth daily. 06/20/14   [provider]  B Complex Vitamins (VITAMIN-B COMPLEX) TABS Take 1 tablet by mouth daily.    [provider]  carvedilol (COREG) 12.5 MG tablet Take 12.5 mg by mouth 2 (two) times daily. 07/05/14   [provider]  cefdinir (OMNICEF) 300 MG capsule Take 300 mg by mouth 2 (two) times daily. Started 5/10 x 10 days 11/03/16   [provider]  clopidogrel (PLAVIX) 75 MG tablet Take 75 mg by mouth daily. with food 04/19/14   [provider]  cyanocobalamin 500 MCG tablet Take 500 mcg by mouth daily.    [provider]  dextromethorphan-guaiFENesin (MUCINEX DM) 30-600 MG 12hr tablet Take 1 tablet by mouth 2 (two) times daily. 11/08/16   Rhetta Mura, MD  folic acid (FOLVITE) 1 MG tablet Take 1 mg by mouth daily.    [provider]  furosemide (LASIX) 20 MG tablet Take 1 tablet (20 mg total) by mouth every other day. 11/08/16 11/08/17  Rhetta Mura, MD  HYDROcodone-homatropine (HYCODAN) 5-1.5 MG/5ML syrup Take 5 mLs by mouth at bedtime. 11/08/16   Rhetta Mura, MD  megestrol (MEGACE) 40 MG tablet Take 1 tablet (40 mg total) by mouth daily. 11/08/16   Rhetta Mura, MD  nitroGLYCERIN (NITROSTAT) 0.4 MG SL tablet Place 0.4 mg under the tongue every 5 (five) minutes as needed for chest pain.    [provider]  protein supplement (RESOURCE BENEPROTEIN) 6 g POWD Take 1 scoop (6 g total) by mouth 3 (three) times daily with meals. 11/08/16   Rhetta Mura, MD  sacubitril-valsartan (ENTRESTO) 24-26 MG Take 1 tablet  by mouth 2 (two) times daily. 11/08/16   Rhetta Mura, MD    Allergies    Hydralazine and Sulfa antibiotics  Review of Systems   Review of Systems  All other systems reviewed and are negative.   Physical Exam Updated Vital Signs BP (!) 174/88 (BP Location: Right Arm)   Pulse 70   Temp 98.5 F (36.9 C) (Oral)   Resp 18   Ht 5\' 4"  (1.626 m)   Wt 52.6 kg   SpO2 96%   BMI 19.91 kg/m   Physical Exam Vitals and nursing note reviewed. Exam conducted with a chaperone present.  Constitutional:      General: She is not in acute distress.    Appearance: Normal appearance. She is not ill-appearing.     Comments: Resting comfortably, no distress.  HENT:     Head: Normocephalic and atraumatic.  Eyes:  General: No scleral icterus.    Conjunctiva/sclera: Conjunctivae normal.  Cardiovascular:     Rate and Rhythm: Normal rate and regular rhythm.     Pulses: Normal pulses.     Heart sounds: Normal heart sounds.  Pulmonary:     Effort: Pulmonary effort is normal. No respiratory distress.     Breath sounds: Normal breath sounds. No wheezing.     Comments: Questionable rales in left lower lobe.  Otherwise CTA bilaterally. Abdominal:     General: Abdomen is flat. There is no distension.     Palpations: Abdomen is soft.     Tenderness: There is no abdominal tenderness.  Musculoskeletal:        General: Normal range of motion.     Cervical back: Normal range of motion and neck supple. No rigidity.  Skin:    General: Skin is dry.     Capillary Refill: Capillary refill takes less than 2 seconds.  Neurological:     General: No focal deficit present.     Mental Status: She is alert and oriented to person, place, and time.     GCS: GCS eye subscore is 4. GCS verbal subscore is 5. GCS motor subscore is 6.     Cranial Nerves: No cranial nerve deficit.     Sensory: No sensory deficit.     Motor: No weakness.     Coordination: Coordination normal.     Gait: Gait normal.    Psychiatric:        Mood and Affect: Mood normal.        Behavior: Behavior normal.        Thought Content: Thought content normal.     ED Results / Procedures / Treatments   Labs (all labs ordered are listed, but only abnormal results are displayed) Labs Reviewed  BASIC METABOLIC PANEL - Abnormal; Notable for the following components:      Result Value   GFR, Estimated 54 (*)    All other components within normal limits  BRAIN NATRIURETIC PEPTIDE - Abnormal; Notable for the following components:   B Natriuretic Peptide 464.9 (*)    All other components within normal limits  TROPONIN I (HIGH SENSITIVITY) - Abnormal; Notable for the following components:   Troponin I (High Sensitivity) 66 (*)    All other components within normal limits  TROPONIN I (HIGH SENSITIVITY) - Abnormal; Notable for the following components:   Troponin I (High Sensitivity) 72 (*)    All other components within normal limits  URINE CULTURE  RESPIRATORY PANEL BY RT PCR (FLU A&B, COVID)  CBC  URINALYSIS, ROUTINE W REFLEX MICROSCOPIC    EKG EKG Interpretation  Date/Time:  Tuesday April 07 2020 12:52:41 EDT Ventricular Rate:  63 PR Interval:  164 QRS Duration: 150 QT Interval:  488 QTC Calculation: 499 R Axis:   -71 Text Interpretation: Atrial-sensed ventricular-paced rhythm Biventricular pacemaker detected Abnormal ECG Confirmed by Tilden Fossa [phone]) on 04/07/2020 2:22:53 PM   Radiology DG Chest 2 View  Result Date: 04/07/2020 CLINICAL DATA:  Chest pain, fatigue EXAM: CHEST - 2 VIEW COMPARISON:  01/08/2020 FINDINGS: Left-sided implanted cardiac device. The heart size and mediastinal contours are stable. Streaky left basilar opacity. Right lung appears clear. No pleural effusion or pneumothorax. Severe degenerative changes of the bilateral glenohumeral joints. IMPRESSION: Streaky left basilar opacity, favor atelectasis. Electronically Signed   By: Duanne Guess D.O.   On: 04/07/2020 13:43     Procedures Procedures (including critical care time)  Medications Ordered  in ED Medications - No data to display  ED Course  I have reviewed the triage vital signs and the nursing notes.  Pertinent labs & imaging results that were available during my care of the patient were reviewed by me and considered in my medical decision making (see chart for details).    MDM Rules/Calculators/A&P                          I spoke with patient's cardiologist, Dr. Rudolpho SevinAkbary.  Patient has HFrEF and ischemic cardiomyopathy.  He reports that she has had cardiac enzymes that were much higher, in the 400s, when she was last seen for CHF exacerbation.  When she had her MI, her enzymes were 40,000+.  He suspects that if she does not appear to be volume overloaded and she is continuing to take her 20 mg Lasix every other day, she is reasonable for discharge and outpatient follow-up with him in the cardiology office.  He is also reassured by the fact that she does not have any pulmonary edema evident on her chest x-ray and that her BNP is only mildly elevated to 464.9.  Patient is safe for discharge.  Discussed case with Dr. Lockie Molauratolo who evaluated patient and agrees with assessment and plan.  All of the evaluation and work-up results were discussed with the patient and any family at bedside.  Patient and/or family were informed that while patient is appropriate for discharge at this time, some medical emergencies may only develop or become detectable after a period of time.  I specifically instructed patient and/or family to return to return to the ED or seek immediate medical attention for any new or worsening symptoms.  They were provided opportunity to ask any additional questions and have none at this time.  Prior to discharge patient is feeling well, agreeable with plan for discharge home.  They have expressed understanding of verbal discharge instructions as well as return precautions and are agreeable to the  plan.   Carolyn Patton was evaluated in Emergency Department on 04/07/2020 for the symptoms described in the history of present illness. She was evaluated in the context of the global COVID-19 pandemic, which necessitated consideration that the patient might be at risk for infection with the SARS-CoV-2 virus that causes COVID-19. Institutional protocols and algorithms that pertain to the evaluation of patients at risk for COVID-19 are in a state of rapid change based on information released by regulatory bodies including the CDC and federal and state organizations. These policies and algorithms were followed during the patient's care in the ED.   Final Clinical Impression(s) / ED Diagnoses Final diagnoses:  Fatigue, unspecified type    Rx / DC Orders ED Discharge Orders    None       Lorelee NewGreen, Glennis Montenegro L, PA-C 04/07/20 1715    Lorelee NewGreen, Tiandre Teall L, PA-C 04/07/20 1716    Virgina Norfolkuratolo, Adam, DO 04/07/20 1748

## 2020-04-07 NOTE — Discharge Instructions (Signed)
Please follow-up with your primary care provider regarding today's encounter.  I would also like for you to follow-up with your cardiologist for ongoing evaluation on an outpatient basis.  Your work-up today was reassuring.  Your discomfort in your shoulders bilaterally that feels like osteoarthritis and is worse with movement and improves with heating pads could simply be arthritic pain.  Continue with heating pads and Tylenol, as needed.  Please return to the ED or seek immediate medical attention should you experience any new or worsening symptoms.

## 2020-04-08 LAB — URINE CULTURE: Culture: NO GROWTH

## 2020-10-25 DEATH — deceased

## 2020-10-28 IMAGING — DX DG CHEST 2V
2 series · 2 of 2 positions shown · non-contrast
Comparison: 01/08/2020

CLINICAL DATA: Chest pain, fatigue

EXAM:
CHEST - 2 VIEW

[chest pa]
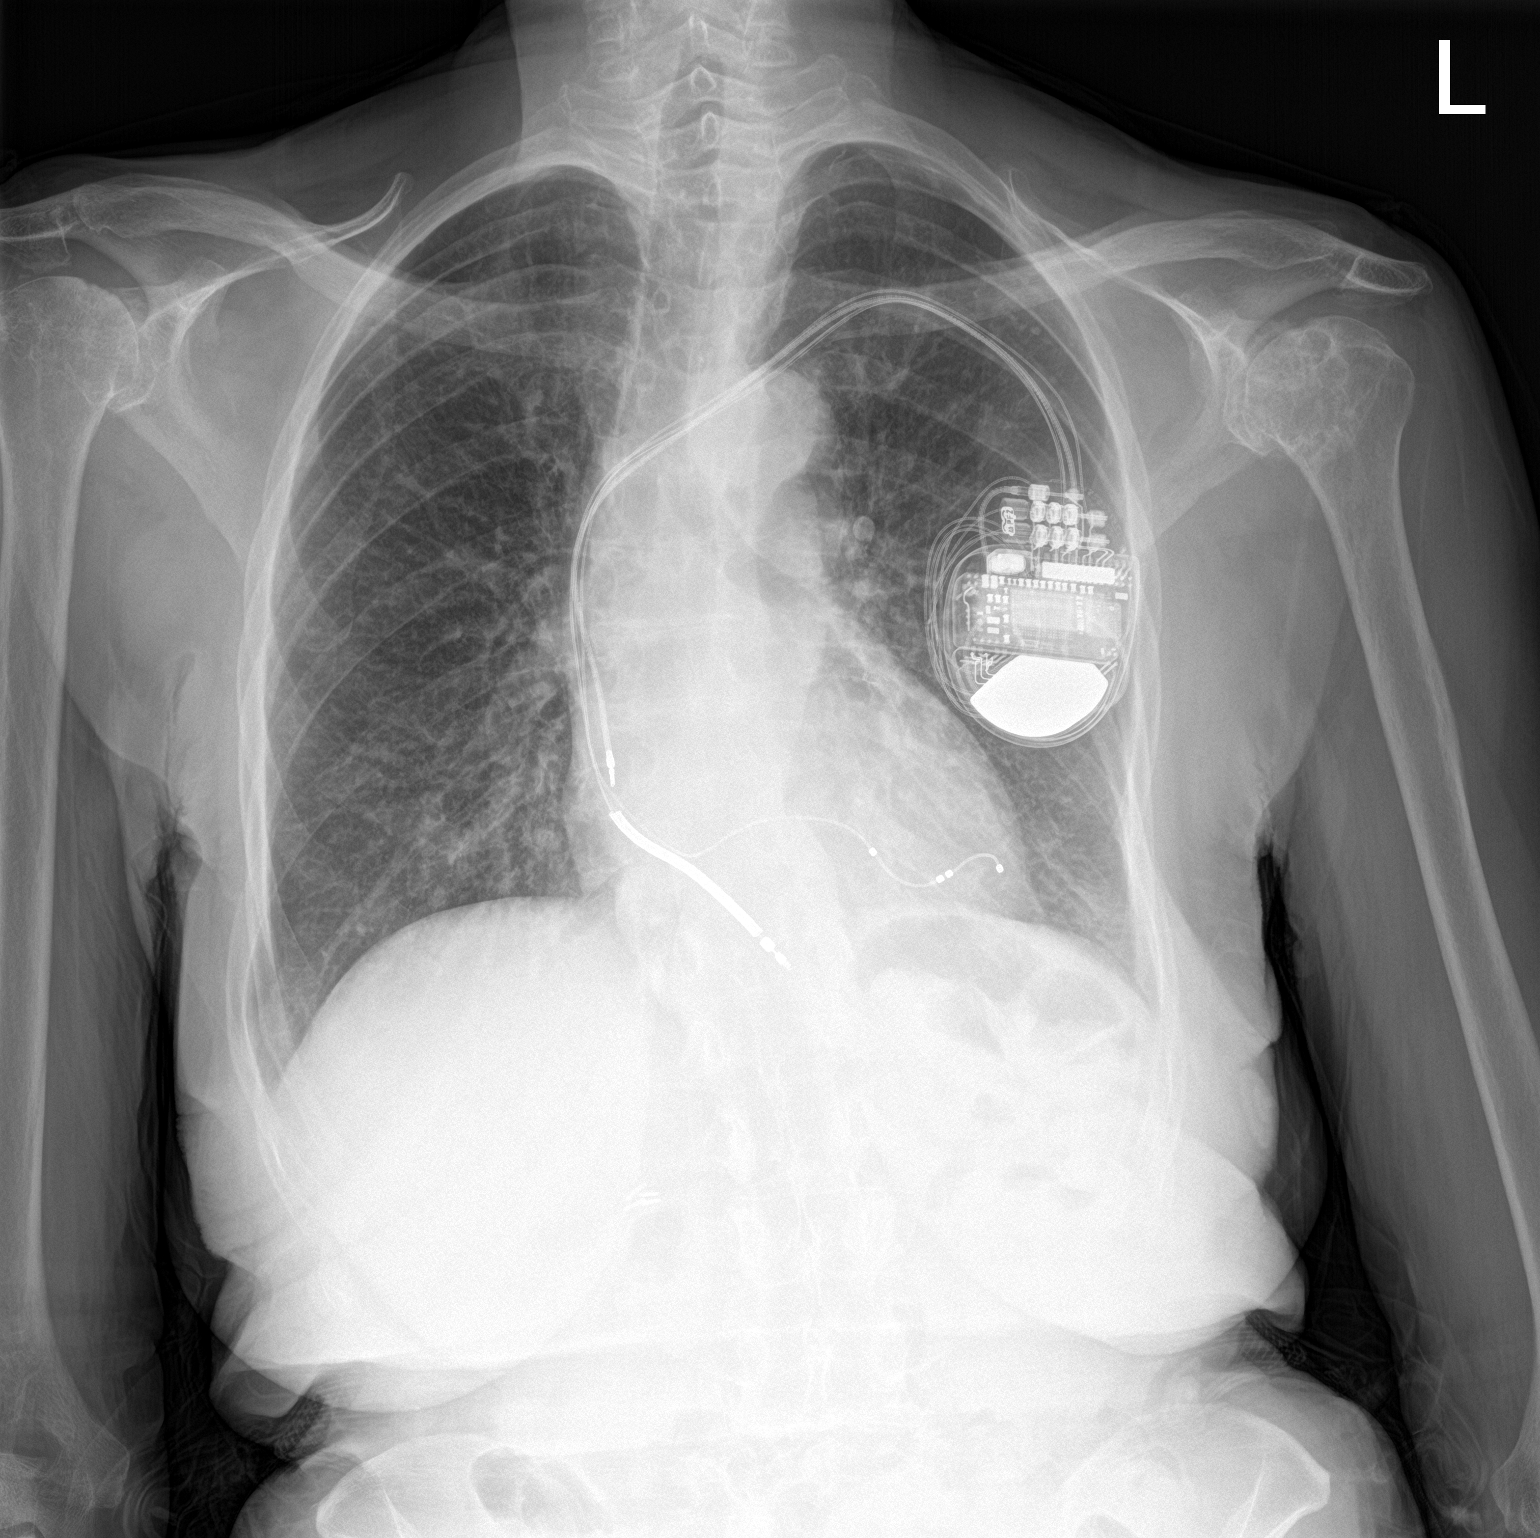

[chest lat]
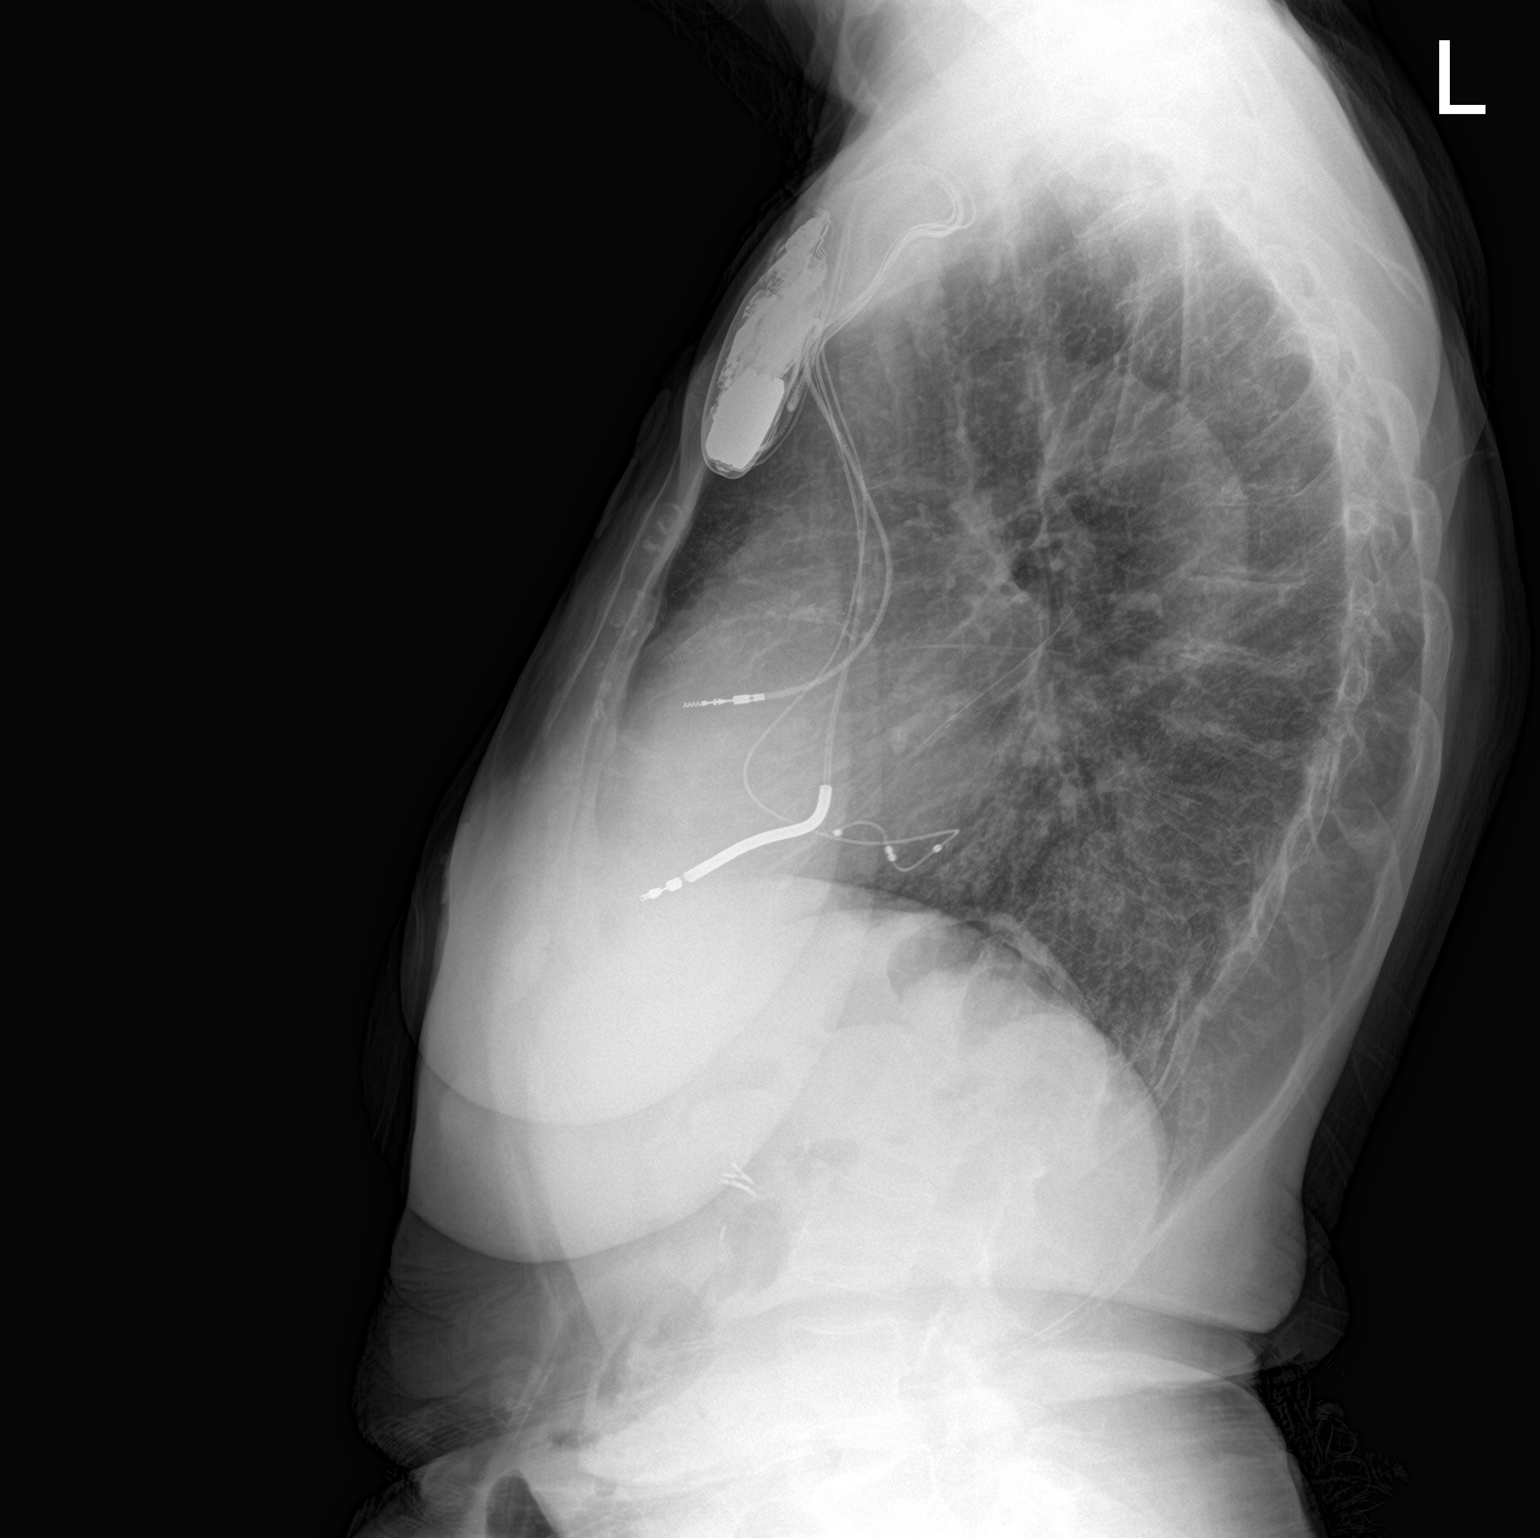

[2 of 2 positions shown; findings below may reference images not displayed]

FINDINGS: Left-sided implanted cardiac device. The heart size and mediastinal
contours are stable. Streaky left basilar opacity. Right lung
appears clear. No pleural effusion or pneumothorax. Severe
degenerative changes of the bilateral glenohumeral joints.
IMPRESSION: Streaky left basilar opacity, favor atelectasis.
